# Patient Record
Sex: Male | Born: 1990 | Race: Black or African American | Hispanic: No | Marital: Single | Smoking: Former smoker
Health system: Southern US, Community
[De-identification: ages and names within clinical notes are randomized; demographics above are authoritative.]

## PROBLEM LIST (undated history)

## (undated) DIAGNOSIS — B20 Human immunodeficiency virus [HIV] disease: Secondary | ICD-10-CM

## (undated) DIAGNOSIS — Z21 Asymptomatic human immunodeficiency virus [HIV] infection status: Secondary | ICD-10-CM

---

## 2017-11-21 ENCOUNTER — Emergency Department: Payer: Managed Care, Other (non HMO)

## 2017-11-21 ENCOUNTER — Encounter: Payer: Self-pay | Admitting: Emergency Medicine

## 2017-11-21 ENCOUNTER — Emergency Department
Admission: EM | Admit: 2017-11-21 | Discharge: 2017-11-21 | Disposition: A | Discharge status: Home / Self Care | Payer: Managed Care, Other (non HMO) | Attending: Emergency Medicine | Admitting: Emergency Medicine

## 2017-11-21 DIAGNOSIS — Y999 Unspecified external cause status: Secondary | ICD-10-CM | POA: Diagnosis not present

## 2017-11-21 DIAGNOSIS — W0110XA Fall on same level from slipping, tripping and stumbling with subsequent striking against unspecified object, initial encounter: Secondary | ICD-10-CM | POA: Insufficient documentation

## 2017-11-21 DIAGNOSIS — Y9301 Activity, walking, marching and hiking: Secondary | ICD-10-CM | POA: Insufficient documentation

## 2017-11-21 DIAGNOSIS — S20212A Contusion of left front wall of thorax, initial encounter: Secondary | ICD-10-CM | POA: Diagnosis not present

## 2017-11-21 DIAGNOSIS — F17200 Nicotine dependence, unspecified, uncomplicated: Secondary | ICD-10-CM | POA: Diagnosis not present

## 2017-11-21 DIAGNOSIS — S299XXA Unspecified injury of thorax, initial encounter: Secondary | ICD-10-CM | POA: Diagnosis present

## 2017-11-21 DIAGNOSIS — Y929 Unspecified place or not applicable: Secondary | ICD-10-CM | POA: Diagnosis not present

## 2017-11-21 MED ORDER — MELOXICAM 15 MG PO TABS: 15.0000 mg | ORAL_TABLET | Freq: Every day | ORAL | 0 refills | Status: AC

## 2017-11-21 NOTE — Discharge Instructions (Addendum)
Please take Mobic 15 mg daily with Tylenol 1000 mg every 6 hours as needed.  Apply ice to the left ribs.  If any shortness of breath, fevers, increasing cough, worsening symptoms or to changes in health return to the emergency department.

## 2017-11-21 NOTE — ED Triage Notes (Signed)
Patient presents to the ED with right rib pain post fall yesterday.  Patient states pain is worse with deep breathing, left arm movement or heavy lifting.  Patient states if he is sitting still he is not in pain.  Patient states he fell after having, "a couple of drinks, states he tripped over his shoe laces."  Patient denies hitting his head or passing out.  Patient is in no obvious distress at this time.

## 2017-11-21 NOTE — ED Provider Notes (Signed)
[facility] EMERGENCY DEPARTMENT Provider Note   CSN: [id number] Arrival date & time: 11/21/17  1805     History   Chief Complaint Chief Complaint  Patient presents with  . Fall    HPI Kent Page is a 27 y.o. male.  Presents to the emergency department for evaluation of left rib pain.  Patient states he fell and injured the left ribs yesterday.  He states he tripped over his shoelaces and landed on the ground.  Denies hitting his head, losing consciousness.  Only complains of left rib pain.  No chest pain, abdominal pain, back pain, upper extremity or lower extremity discomfort.  He has not take any medications for pain.  His pain is moderate, sharp and increased with taking a deep breath.  He gets relief with being still.  HPI  History reviewed. No pertinent past medical history.  There are no active problems to display for this patient.   History reviewed. No pertinent surgical history.      Home Medications    Prior to Admission medications   Medication Sig Start Date End Date Taking? Authorizing Provider  meloxicam (MOBIC) 15 MG tablet Take 1 tablet (15 mg total) by mouth daily. 11/21/17 11/21/18  Evon Slack, PA-C    Family History No family history on file.  Social History Social History   Tobacco Use  . Smoking status: Current Some Day Smoker  . Smokeless tobacco: Never Used  Substance Use Topics  . Alcohol use: Yes    Comment: daily  . Drug use: Not on file     Allergies   Patient has no known allergies.   Review of Systems Review of Systems  Constitutional: Negative.  Negative for activity change, appetite change, chills and fever.  Eyes: Negative for photophobia, pain and discharge.  Respiratory: Negative for cough, chest tightness and shortness of breath.   Cardiovascular: Positive for chest pain. Negative for leg swelling.  Gastrointestinal: Negative for abdominal distention, abdominal pain, diarrhea, nausea  and vomiting.  Musculoskeletal: Negative for arthralgias, back pain, gait problem and neck pain.  Skin: Negative for color change and rash.  Neurological: Negative for dizziness and headaches.  Hematological: Negative for adenopathy.  Psychiatric/Behavioral: Negative for agitation and behavioral problems.     Physical Exam Updated Vital Signs BP 132/65 (BP Location: Left Arm)   Pulse 70   Temp 98.1 F (36.7 C) (Oral)   Resp 18   Ht 5\' 6"  (1.676 m)   Wt 64.4 kg   SpO2 100%   BMI 22.92 kg/m   Physical Exam  Constitutional: He is oriented to person, place, and time. He appears well-developed and well-nourished.  HENT:  Head: Normocephalic and atraumatic.  Eyes: Conjunctivae are normal.  Neck: Normal range of motion.  Cardiovascular: Normal rate.  Pulmonary/Chest: Effort normal. No stridor. No respiratory distress. He has no wheezes. He has no rales. He exhibits tenderness (Positive left rib tenderness along the axillary line at the level of the xiphoid.  No bruising, step-off.  Mild tenderness palpation.).  Abdominal: Soft. He exhibits no distension. There is no tenderness. There is no guarding.  Musculoskeletal: Normal range of motion.  Neurological: He is alert and oriented to person, place, and time.  Skin: Skin is warm. No rash noted.  Psychiatric: He has a normal mood and affect. His behavior is normal. Thought content normal.     ED Treatments / Results  Labs (all labs ordered are listed, but only abnormal results are displayed)  Labs Reviewed - No data to display  EKG None  Radiology Dg Ribs Unilateral W/chest Left  Result Date: 11/21/2017 CLINICAL DATA:  Initial evaluation for acute trauma, fall, left rib pain. EXAM: LEFT RIBS AND CHEST - 3+ VIEW COMPARISON:  None. FINDINGS: Cardiac and mediastinal silhouettes are within normal limits. Lungs normally inflated. No focal infiltrates. No pulmonary edema or pleural effusion. No pneumothorax. Dedicated views of the  left ribs were performed. No acute displaced rib fracture identified. No other acute osseous abnormality. IMPRESSION: 1. No acute displaced rib fracture identified. 2. No other active cardiopulmonary disease. Electronically Signed   By: Rise Mu M.D.   On: 11/21/2017 19:30    Procedures Procedures (including critical care time)  Medications Ordered in ED Medications - No data to display   Initial Impression / Assessment and Plan / ED Course  I have reviewed the triage vital signs and the nursing notes.  Pertinent labs & imaging results that were available during my care of the patient were reviewed by me and considered in my medical decision making (see chart for details).     27 year old male with left rib pain after fall 1 day ago.  Pain is been mild to moderate, he has been without any medications for his pain.  He is started on meloxicam.  Vital signs are stable.  X-rays show no evidence of acute cardiopulmonary process nor any displaced left rib fracture.  Patient understands signs symptoms return to the ED for.  Final Clinical Impressions(s) / ED Diagnoses   Final diagnoses:  Rib contusion, left, initial encounter    ED Discharge Orders         Ordered    meloxicam (MOBIC) 15 MG tablet  Daily     11/21/17 2001           Ronnette Juniper 11/21/17 2010    Dionne Bucy, MD 11/21/17 2351

## 2021-06-19 ENCOUNTER — Other Ambulatory Visit: Payer: Self-pay

## 2021-06-19 ENCOUNTER — Emergency Department
Admission: EM | Admit: 2021-06-19 | Discharge: 2021-06-19 | Disposition: A | Discharge status: Home / Self Care | Payer: Self-pay | Attending: Emergency Medicine | Admitting: Emergency Medicine

## 2021-06-19 ENCOUNTER — Emergency Department: Payer: Self-pay

## 2021-06-19 DIAGNOSIS — R21 Rash and other nonspecific skin eruption: Secondary | ICD-10-CM | POA: Insufficient documentation

## 2021-06-19 DIAGNOSIS — Z21 Asymptomatic human immunodeficiency virus [HIV] infection status: Secondary | ICD-10-CM | POA: Insufficient documentation

## 2021-06-19 DIAGNOSIS — R000 Tachycardia, unspecified: Secondary | ICD-10-CM | POA: Insufficient documentation

## 2021-06-19 DIAGNOSIS — J189 Pneumonia, unspecified organism: Secondary | ICD-10-CM | POA: Insufficient documentation

## 2021-06-19 DIAGNOSIS — R630 Anorexia: Secondary | ICD-10-CM | POA: Insufficient documentation

## 2021-06-19 DIAGNOSIS — R824 Acetonuria: Secondary | ICD-10-CM | POA: Insufficient documentation

## 2021-06-19 LAB — TROPONIN I (HIGH SENSITIVITY)
Troponin I (High Sensitivity): 4 ng/L (ref <18)
Troponin I (High Sensitivity): <2 ng/L (ref <18)

## 2021-06-19 LAB — URINALYSIS, ROUTINE W REFLEX MICROSCOPIC
Bacteria, UA: NONE SEEN
Bilirubin Urine: NEGATIVE
Glucose, UA: NEGATIVE mg/dL
Hgb urine dipstick: NEGATIVE
Ketones, ur: 20 mg/dL — AB
Leukocytes,Ua: NEGATIVE
Nitrite: NEGATIVE
Protein, ur: 100 mg/dL — AB
Specific Gravity, Urine: 1.027 (ref 1.005–1.030)
Squamous Epithelial / LPF: NONE SEEN (ref 0–5)
pH: 6 (ref 5.0–8.0)

## 2021-06-19 LAB — CBC WITH DIFFERENTIAL/PLATELET
Abs Immature Granulocytes: 0.03 10*3/uL (ref 0.00–0.07)
Basophils Absolute: 0 10*3/uL (ref 0.0–0.1)
Basophils Relative: 0 %
Eosinophils Absolute: 0.2 10*3/uL (ref 0.0–0.5)
Eosinophils Relative: 3 %
HCT: 38.9 % — ABNORMAL LOW (ref 39.0–52.0)
Hemoglobin: 13 g/dL (ref 13.0–17.0)
Immature Granulocytes: 0 %
Lymphocytes Relative: 17 %
Lymphs Abs: 1.2 10*3/uL (ref 0.7–4.0)
MCH: 29 pg (ref 26.0–34.0)
MCHC: 33.4 g/dL (ref 30.0–36.0)
MCV: 86.6 fL (ref 80.0–100.0)
Monocytes Absolute: 0.7 10*3/uL (ref 0.1–1.0)
Monocytes Relative: 10 %
Neutro Abs: 4.8 10*3/uL (ref 1.7–7.7)
Neutrophils Relative %: 70 %
Platelets: 494 10*3/uL — ABNORMAL HIGH (ref 150–400)
RBC: 4.49 MIL/uL (ref 4.22–5.81)
RDW: 12.1 % (ref 11.5–15.5)
WBC: 7 10*3/uL (ref 4.0–10.5)
nRBC: 0 % (ref 0.0–0.2)

## 2021-06-19 LAB — TSH: TSH: 0.942 u[IU]/mL (ref 0.350–4.500)

## 2021-06-19 LAB — COMPREHENSIVE METABOLIC PANEL
ALT: 23 U/L (ref 0–44)
AST: 29 U/L (ref 15–41)
Albumin: 2.6 g/dL — ABNORMAL LOW (ref 3.5–5.0)
Alkaline Phosphatase: 53 U/L (ref 38–126)
Anion gap: 10 (ref 5–15)
BUN: 15 mg/dL (ref 6–20)
CO2: 25 mmol/L (ref 22–32)
Calcium: 8.5 mg/dL — ABNORMAL LOW (ref 8.9–10.3)
Chloride: 100 mmol/L (ref 98–111)
Creatinine, Ser: 1.21 mg/dL (ref 0.61–1.24)
GFR, Estimated: >60 mL/min (ref >60)
Glucose, Bld: 99 mg/dL (ref 70–99)
Potassium: 4.3 mmol/L (ref 3.5–5.1)
Sodium: 135 mmol/L (ref 135–145)
Total Bilirubin: 0.5 mg/dL (ref 0.3–1.2)
Total Protein: 7.7 g/dL (ref 6.5–8.1)

## 2021-06-19 LAB — RAPID HIV SCREEN (HIV 1/2 AB+AG)
HIV 1/2 Antibodies: REACTIVE — AB
HIV-1 P24 Antigen - HIV24: NONREACTIVE

## 2021-06-19 LAB — CHLAMYDIA/NGC RT PCR (ARMC ONLY)
Chlamydia Tr: NOT DETECTED
N gonorrhoeae: NOT DETECTED

## 2021-06-19 LAB — HIV ANTIBODY (ROUTINE TESTING W REFLEX): HIV Screen 4th Generation wRfx: REACTIVE — AB

## 2021-06-19 MED ORDER — SODIUM CHLORIDE 0.9 % IV SOLN
2.0000 g | Freq: Once | INTRAVENOUS | Status: AC
  Administered 2021-06-19: 2 g via INTRAVENOUS
  Filled 2021-06-19: qty 20

## 2021-06-19 MED ORDER — SODIUM CHLORIDE 0.9 % IV SOLN
500.0000 mg | Freq: Once | INTRAVENOUS | Status: AC
  Administered 2021-06-19: 500 mg via INTRAVENOUS
  Filled 2021-06-19: qty 5

## 2021-06-19 MED ORDER — LEVOFLOXACIN 500 MG PO TABS: 500.0000 mg | ORAL_TABLET | Freq: Every day | ORAL | 0 refills | Status: DC

## 2021-06-19 NOTE — ED Provider Notes (Signed)
? ?[facility] ?Provider Note ? ? ? Event Date/Time  ? First MD Initiated Contact with Patient 06/19/21 1236   ?  (approximate) ? ? ?History  ? ?Rash ? ? ?HPI ? ?Kent Page is a 31 y.o. male with history of male male unprotected sex presents emergency department complaining of a cough, shortness of breath, rash, decreased appetite and 20 pound weight loss in the past few months.  Patient does not have a diagnosis of HIV.  He states he gets fevers on and off.  He is a Agricultural consultant.  Denies vomiting or diarrhea. ? ?  ? ? ?Physical Exam  ? ?Triage Vital Signs: ?ED Triage Vitals [06/19/21 1211]  ?Enc Vitals Group  ?   BP   ?   Pulse   ?   Resp   ?   Temp   ?   Temp src   ?   SpO2   ?   Weight   ?   Height   ?   Head Circumference   ?   Peak Flow   ?   Pain Score 0  ?   Pain Loc   ?   Pain Edu?   ?   Excl. in GC?   ? ? ?Most recent vital signs: ?Vitals:  ? 06/19/21 1518  ?BP: 107/71  ?Pulse: 98  ?Resp: 14  ?Temp: 98.7 ?F (37.1 ?C)  ?SpO2: 98%  ? ? ? ?General: Awake, no distress.   ?CV:  Good peripheral perfusion. regular rate and  rhythm ?Resp:  Normal effort. Lungs CTA ?Abd:  No distention.   ?Other:    ? ? ?ED Results / Procedures / Treatments  ? ?Labs ?(all labs ordered are listed, but only abnormal results are displayed) ?Labs Reviewed  ?COMPREHENSIVE METABOLIC PANEL - Abnormal; Notable for the following components:  ?    Result Value  ? Calcium 8.5 (*)   ? Albumin 2.6 (*)   ? All other components within normal limits  ?CBC WITH DIFFERENTIAL/PLATELET - Abnormal; Notable for the following components:  ? HCT 38.9 (*)   ? Platelets 494 (*)   ? All other components within normal limits  ?URINALYSIS, ROUTINE W REFLEX MICROSCOPIC - Abnormal; Notable for the following components:  ? Color, Urine YELLOW (*)   ? APPearance CLEAR (*)   ? Ketones, ur 20 (*)   ? Protein, ur 100 (*)   ? All other components within normal limits  ?RAPID HIV SCREEN (HIV 1/2 AB+AG) - Abnormal; Notable for  the following components:  ? HIV 1/2 Antibodies Reactive (*)   ? All other components within normal limits  ?CHLAMYDIA/NGC RT PCR ([facility] ONLY)            ?TSH  ?RPR  ?HIV ANTIBODY (ROUTINE TESTING W REFLEX)  ?HELPER T-LYMPH-CD4 ([facility] ONLY)  ?HIV-1 RNA QUANT-NO REFLEX-BLD  ?GENOSURE INTEGRASE HIV EDI  ?TROPONIN I (HIGH SENSITIVITY)  ?TROPONIN I (HIGH SENSITIVITY)  ? ? ? ?EKG ? ?EKG ? ? ?RADIOLOGY ?Chest x-ray ? ? ? ?PROCEDURES: ? ? ?Procedures ? ? ?MEDICATIONS ORDERED IN ED: ?Medications  ?azithromycin (ZITHROMAX) 500 mg in sodium chloride 0.9 % 250 mL IVPB (500 mg Intravenous New Bag/Given 06/19/21 1601)  ?cefTRIAXone (ROCEPHIN) 2 g in sodium chloride 0.9 % 100 mL IVPB (0 g Intravenous Stopped 06/19/21 1601)  ? ? ? ?IMPRESSION / MDM / ASSESSMENT AND PLAN / ED COURSE  ?I reviewed the triage vital signs and the nursing notes. ?             ?               ? ?  Differential diagnosis includes, but is not limited to, HIV, AIDS, CAP, PCP pneumonia, hypothyroidism, syphilis, MI, CHF, PE ? ?The rash on the back of the patient's neck is concerning for may be secondary syphilis.  We will do RPR and HIV to assess HIV status and syphilis status. ? ?GC/chlamydia ordered due to patient's unprotected sex with someone that is a random person. ? ?Blood work for shortness of breath also ordered.  Chest x-ray and EKG. ? ?EKG showed tachycardia at 108.  Patient's heart rate has returned to normal at this time still to 98. ? ?Chest x-ray was independently reviewed by me, does appear to show some pneumonia, read as atypical pneumonia by radiology ? ?Due to the patient's weight loss, rash, fatigue, and male male sex I did do a HIV test.  HIV is reactive.  However patient's other labs are reassuring his CBC, metabolic panel troponin and TSH are all normal.  Urinalysis showed 20 ketones which we will rehydrate him prior to discharge.  GC/chlamydia is negative, RPR still pending ? ?Due to the atypical pneumonia we gave him Rocephin 2 g IV and  Zithromax 500 mg IV.  Patient will be discharged on Levaquin. ? ?Consult infectious disease.  Appointment was made for him to follow-up with Dr. Daleen Bo, he is to see her on May 18 at 945.  Did add lab work for infectious disease including Geno sure integrates HIV, CD4 count, HIV RNA.  These will be followed up by infectious disease. ? ?Patient was given strict instructions to return if worsening.  He is in agreement treatment plan.  Discharged stable condition.  Encouraged to sign up for [facility] health MyChart so he can see all of his lab work.  He was given caring for yourself with HIV instructions along with pneumonia instructions. ? ? ? ? ?  ? ? ?FINAL CLINICAL IMPRESSION(S) / ED DIAGNOSES  ? ?Final diagnoses:  ?Atypical pneumonia  ?HIV test positive (HCC)  ? ? ? ?Rx / DC Orders  ? ?ED Discharge Orders   ? ?      Ordered  ?  levofloxacin (LEVAQUIN) 500 MG tablet  Daily       ? 06/19/21 1742  ? ?  ?  ? ?  ? ? ? ?Note:  This document was prepared using Dragon voice recognition software and may include unintentional dictation errors. ? ?  ?Faythe Ghee, PA-C ?06/19/21 1800 ? ?  ?Jene Every, MD ?06/21/21 1424 ? ?

## 2021-06-19 NOTE — Discharge Instructions (Addendum)
Follow up with infectious disease on May 18 at 945 am.  Return to the ER if worsening ?Take the  medication as prescribed ?

## 2021-06-19 NOTE — ED Triage Notes (Signed)
Pt comes with c/o rash and lost of appetite for few days.  ?

## 2021-06-19 NOTE — ED Notes (Signed)
E signature pad not working. Pt educated on discharge instructions and verbalized understanding.  

## 2021-06-20 LAB — RPR: RPR Ser Ql: NONREACTIVE

## 2021-06-21 LAB — HELPER T-LYMPH-CD4 (ARMC ONLY)
% CD 4 Pos. Lymph.: 2.5 % — ABNORMAL LOW (ref 30.8–58.5)
Absolute CD 4 Helper: 28 /uL — ABNORMAL LOW (ref 359–1519)
Basophils Absolute: 0 10*3/uL (ref 0.0–0.2)
Basos: 0 %
EOS (ABSOLUTE): 0.2 10*3/uL (ref 0.0–0.4)
Eos: 3 %
Hematocrit: 36.3 % — ABNORMAL LOW (ref 37.5–51.0)
Hemoglobin: 12 g/dL — ABNORMAL LOW (ref 13.0–17.7)
Immature Grans (Abs): 0.1 10*3/uL (ref 0.0–0.1)
Immature Granulocytes: 1 %
Lymphocytes Absolute: 1.1 10*3/uL (ref 0.7–3.1)
Lymphs: 16 %
MCH: 29.3 pg (ref 26.6–33.0)
MCHC: 33.1 g/dL (ref 31.5–35.7)
MCV: 89 fL (ref 79–97)
Monocytes Absolute: 0.8 10*3/uL (ref 0.1–0.9)
Monocytes: 12 %
Neutrophils Absolute: 4.6 10*3/uL (ref 1.4–7.0)
Neutrophils: 68 %
Platelets: 496 10*3/uL — ABNORMAL HIGH (ref 150–450)
RBC: 4.09 x10E6/uL — ABNORMAL LOW (ref 4.14–5.80)
RDW: 12.3 % (ref 11.6–15.4)
WBC: 6.8 10*3/uL (ref 3.4–10.8)

## 2021-06-21 LAB — HIV-1 RNA QUANT-NO REFLEX-BLD
HIV 1 RNA Quant: 421000 copies/mL
LOG10 HIV-1 RNA: 5.624 log10copy/mL

## 2021-06-22 LAB — HIV-1/2 AB - DIFFERENTIATION
HIV 1 Ab: REACTIVE
HIV 2 Ab: NONREACTIVE

## 2021-06-26 ENCOUNTER — Other Ambulatory Visit: Payer: Self-pay

## 2021-06-26 ENCOUNTER — Encounter: Payer: Self-pay | Admitting: Internal Medicine

## 2021-06-26 ENCOUNTER — Inpatient Hospital Stay
Admission: EM | Admit: 2021-06-26 | Discharge: 2021-07-04 | DRG: 974 | Disposition: A | Discharge status: Home / Self Care | Payer: No Typology Code available for payment source | Attending: Obstetrics and Gynecology | Admitting: Obstetrics and Gynecology

## 2021-06-26 ENCOUNTER — Encounter: Payer: Self-pay | Admitting: Infectious Diseases

## 2021-06-26 ENCOUNTER — Ambulatory Visit: Payer: No Typology Code available for payment source | Attending: Infectious Diseases | Admitting: Infectious Diseases

## 2021-06-26 ENCOUNTER — Emergency Department: Payer: No Typology Code available for payment source

## 2021-06-26 VITALS — BP 103/71 | HR 113 | Temp 97.5°F | Ht 66.0 in | Wt 117.0 lb

## 2021-06-26 DIAGNOSIS — R0600 Dyspnea, unspecified: Secondary | ICD-10-CM | POA: Diagnosis not present

## 2021-06-26 DIAGNOSIS — R7989 Other specified abnormal findings of blood chemistry: Secondary | ICD-10-CM

## 2021-06-26 DIAGNOSIS — B20 Human immunodeficiency virus [HIV] disease: Secondary | ICD-10-CM | POA: Insufficient documentation

## 2021-06-26 DIAGNOSIS — E875 Hyperkalemia: Secondary | ICD-10-CM | POA: Diagnosis not present

## 2021-06-26 DIAGNOSIS — E43 Unspecified severe protein-calorie malnutrition: Secondary | ICD-10-CM | POA: Diagnosis present

## 2021-06-26 DIAGNOSIS — R0902 Hypoxemia: Secondary | ICD-10-CM | POA: Insufficient documentation

## 2021-06-26 DIAGNOSIS — E871 Hypo-osmolality and hyponatremia: Secondary | ICD-10-CM | POA: Diagnosis not present

## 2021-06-26 DIAGNOSIS — J432 Centrilobular emphysema: Secondary | ICD-10-CM | POA: Diagnosis present

## 2021-06-26 DIAGNOSIS — N179 Acute kidney failure, unspecified: Secondary | ICD-10-CM | POA: Diagnosis present

## 2021-06-26 DIAGNOSIS — Z20822 Contact with and (suspected) exposure to covid-19: Secondary | ICD-10-CM | POA: Diagnosis present

## 2021-06-26 DIAGNOSIS — B59 Pneumocystosis: Secondary | ICD-10-CM

## 2021-06-26 DIAGNOSIS — R0602 Shortness of breath: Secondary | ICD-10-CM | POA: Diagnosis present

## 2021-06-26 DIAGNOSIS — F1210 Cannabis abuse, uncomplicated: Secondary | ICD-10-CM | POA: Diagnosis present

## 2021-06-26 DIAGNOSIS — I959 Hypotension, unspecified: Secondary | ICD-10-CM | POA: Diagnosis present

## 2021-06-26 DIAGNOSIS — E8809 Other disorders of plasma-protein metabolism, not elsewhere classified: Secondary | ICD-10-CM | POA: Diagnosis present

## 2021-06-26 DIAGNOSIS — J189 Pneumonia, unspecified organism: Secondary | ICD-10-CM | POA: Diagnosis not present

## 2021-06-26 DIAGNOSIS — D75839 Thrombocytosis, unspecified: Secondary | ICD-10-CM | POA: Diagnosis not present

## 2021-06-26 DIAGNOSIS — D649 Anemia, unspecified: Secondary | ICD-10-CM | POA: Diagnosis present

## 2021-06-26 DIAGNOSIS — Z716 Tobacco abuse counseling: Secondary | ICD-10-CM

## 2021-06-26 DIAGNOSIS — B259 Cytomegaloviral disease, unspecified: Secondary | ICD-10-CM | POA: Diagnosis not present

## 2021-06-26 DIAGNOSIS — Z681 Body mass index (BMI) 19 or less, adult: Secondary | ICD-10-CM | POA: Diagnosis not present

## 2021-06-26 HISTORY — DX: Asymptomatic human immunodeficiency virus (hiv) infection status: Z21

## 2021-06-26 HISTORY — DX: Human immunodeficiency virus (HIV) disease: B20

## 2021-06-26 LAB — COMPREHENSIVE METABOLIC PANEL
ALT: 29 U/L (ref 0–44)
AST: 35 U/L (ref 15–41)
Albumin: 2.4 g/dL — ABNORMAL LOW (ref 3.5–5.0)
Alkaline Phosphatase: 50 U/L (ref 38–126)
Anion gap: 12 (ref 5–15)
BUN: 13 mg/dL (ref 6–20)
CO2: 24 mmol/L (ref 22–32)
Calcium: 8.5 mg/dL — ABNORMAL LOW (ref 8.9–10.3)
Chloride: 99 mmol/L (ref 98–111)
Creatinine, Ser: 1.11 mg/dL (ref 0.61–1.24)
GFR, Estimated: >60 mL/min (ref >60)
Glucose, Bld: 146 mg/dL — ABNORMAL HIGH (ref 70–99)
Potassium: 3.9 mmol/L (ref 3.5–5.1)
Sodium: 135 mmol/L (ref 135–145)
Total Bilirubin: 0.5 mg/dL (ref 0.3–1.2)
Total Protein: 7.4 g/dL (ref 6.5–8.1)

## 2021-06-26 LAB — BLOOD GAS, ARTERIAL
Acid-Base Excess: 1.8 mmol/L (ref 0.0–2.0)
Bicarbonate: 24.4 mmol/L (ref 20.0–28.0)
O2 Saturation: 96.6 %
Patient temperature: 37
pCO2 arterial: 32 mmHg (ref 32–48)
pH, Arterial: 7.49 — ABNORMAL HIGH (ref 7.35–7.45)
pO2, Arterial: 79 mmHg — ABNORMAL LOW (ref 83–108)

## 2021-06-26 LAB — CBC WITH DIFFERENTIAL/PLATELET
Abs Immature Granulocytes: 0.05 10*3/uL (ref 0.00–0.07)
Basophils Absolute: 0 10*3/uL (ref 0.0–0.1)
Basophils Relative: 0 %
Eosinophils Absolute: 0.2 10*3/uL (ref 0.0–0.5)
Eosinophils Relative: 2 %
HCT: 36.8 % — ABNORMAL LOW (ref 39.0–52.0)
Hemoglobin: 12.2 g/dL — ABNORMAL LOW (ref 13.0–17.0)
Immature Granulocytes: 1 %
Lymphocytes Relative: 8 %
Lymphs Abs: 0.8 10*3/uL (ref 0.7–4.0)
MCH: 28.8 pg (ref 26.0–34.0)
MCHC: 33.2 g/dL (ref 30.0–36.0)
MCV: 87 fL (ref 80.0–100.0)
Monocytes Absolute: 0.7 10*3/uL (ref 0.1–1.0)
Monocytes Relative: 7 %
Neutro Abs: 7.8 10*3/uL — ABNORMAL HIGH (ref 1.7–7.7)
Neutrophils Relative %: 82 %
Platelets: 606 10*3/uL — ABNORMAL HIGH (ref 150–400)
RBC: 4.23 MIL/uL (ref 4.22–5.81)
RDW: 12.1 % (ref 11.5–15.5)
WBC: 9.4 10*3/uL (ref 4.0–10.5)
nRBC: 0 % (ref 0.0–0.2)

## 2021-06-26 LAB — LACTATE DEHYDROGENASE: LDH: 190 U/L (ref 98–192)

## 2021-06-26 LAB — RESP PANEL BY RT-PCR (FLU A&B, COVID) ARPGX2
Influenza A by PCR: NEGATIVE
Influenza B by PCR: NEGATIVE
SARS Coronavirus 2 by RT PCR: NEGATIVE

## 2021-06-26 LAB — TROPONIN I (HIGH SENSITIVITY): Troponin I (High Sensitivity): 4 ng/L (ref <18)

## 2021-06-26 LAB — LACTIC ACID, PLASMA
Lactic Acid, Venous: 2.4 mmol/L (ref 0.5–1.9)
Lactic Acid, Venous: 0.8 mmol/L (ref 0.5–1.9)

## 2021-06-26 MED ORDER — SODIUM CHLORIDE 0.9 % IV BOLUS
1000.0000 mL | Freq: Once | INTRAVENOUS | Status: AC
  Administered 2021-06-26: 1000 mL via INTRAVENOUS

## 2021-06-26 MED ORDER — SODIUM CHLORIDE 0.9% FLUSH
3.0000 mL | Freq: Two times a day (BID) | INTRAVENOUS | Status: DC
  Administered 2021-06-26 – 2021-07-04 (×16): 3 mL via INTRAVENOUS

## 2021-06-26 MED ORDER — SODIUM CHLORIDE 0.9 % IV SOLN: 250.0000 mL | INTRAVENOUS | Status: DC | PRN

## 2021-06-26 MED ORDER — SODIUM CHLORIDE 0.9% FLUSH: 3.0000 mL | INTRAVENOUS | Status: DC | PRN

## 2021-06-26 MED ORDER — ALBUTEROL SULFATE (2.5 MG/3ML) 0.083% IN NEBU: 2.5000 mg | INHALATION_SOLUTION | RESPIRATORY_TRACT | Status: DC | PRN

## 2021-06-26 MED ORDER — ACETAMINOPHEN 650 MG RE SUPP: 650.0000 mg | Freq: Four times a day (QID) | RECTAL | Status: DC | PRN

## 2021-06-26 MED ORDER — ONDANSETRON HCL 4 MG PO TABS: 4.0000 mg | ORAL_TABLET | Freq: Four times a day (QID) | ORAL | Status: DC | PRN

## 2021-06-26 MED ORDER — ENOXAPARIN SODIUM 40 MG/0.4ML IJ SOSY
40.0000 mg | PREFILLED_SYRINGE | INTRAMUSCULAR | Status: DC
  Administered 2021-06-26 – 2021-07-03 (×8): 40 mg via SUBCUTANEOUS
  Filled 2021-06-26 (×8): qty 0.4

## 2021-06-26 MED ORDER — ACETAMINOPHEN 325 MG PO TABS
650.0000 mg | ORAL_TABLET | Freq: Four times a day (QID) | ORAL | Status: DC | PRN
  Administered 2021-06-28 – 2021-06-29 (×3): 650 mg via ORAL
  Filled 2021-06-26 (×3): qty 2

## 2021-06-26 MED ORDER — ONDANSETRON HCL 4 MG/2ML IJ SOLN
4.0000 mg | Freq: Four times a day (QID) | INTRAMUSCULAR | Status: DC | PRN
  Administered 2021-06-30: 4 mg via INTRAVENOUS
  Filled 2021-06-26: qty 2

## 2021-06-26 MED ORDER — SULFAMETHOXAZOLE-TRIMETHOPRIM 400-80 MG/5ML IV SOLN
320.0000 mg | Freq: Three times a day (TID) | INTRAVENOUS | Status: AC
  Administered 2021-06-26 – 2021-07-03 (×20): 320 mg via INTRAVENOUS
  Filled 2021-06-26 (×16): qty 20
  Filled 2021-06-26: qty 10
  Filled 2021-06-26 (×4): qty 20

## 2021-06-26 MED ORDER — SENNOSIDES-DOCUSATE SODIUM 8.6-50 MG PO TABS: 1.0000 | ORAL_TABLET | Freq: Every evening | ORAL | Status: DC | PRN

## 2021-06-26 MED ORDER — PANTOPRAZOLE SODIUM 40 MG PO TBEC
40.0000 mg | DELAYED_RELEASE_TABLET | Freq: Every day | ORAL | Status: DC
  Administered 2021-06-26 – 2021-07-04 (×8): 40 mg via ORAL
  Filled 2021-06-26 (×8): qty 1

## 2021-06-26 MED ORDER — PREDNISONE 20 MG PO TABS
40.0000 mg | ORAL_TABLET | Freq: Two times a day (BID) | ORAL | Status: DC
  Administered 2021-06-26: 40 mg via ORAL
  Filled 2021-06-26: qty 2

## 2021-06-26 NOTE — Progress Notes (Signed)
Pharmacy Antibiotic Note ? ?Kent Page is a 31 y.o. male admitted on 06/26/2021 with possible pneumocystis pneumonia.  Pharmacy has been consulted for trimethoprim/sulfamethoxazole dosing. Presenting from ID clinic with shortness of breath.  He is newly diagnosed with HIV with a CD4 = 29.   ? ?Today, 06/26/2021 ?CXR with Bilateral ground glass opacities ?WBC WNL ?No fevers ?SCr WNL ?K+ WNL ?LDH = 190 ? ?Plan: ?TMP/SMZ 320mg  IV q8h (=17.6 mg/kg/day of TMP) ?Monitor renal function and K+ - CMP ordered for tomorrow ?Await labs: Beta-D glucan ordered ?Await possible bronchoscopy ? ?Height: 5\' 6"  (167.6 cm) ?Weight: 54.4 kg (120 lb) ?IBW/kg (Calculated) : 63.8 ? ?Temp (24hrs), Avg:98 ?F (36.7 ?C), Min:97.5 ?F (36.4 ?C), Max:98.9 ?F (37.2 ?C) ? ?Recent Labs  ?Lab 06/19/21 ?1827 06/26/21 ?1150 06/26/21 ?1151  ?WBC 6.8  --  9.4  ?CREATININE  --   --  1.11  ?LATICACIDVEN  --  2.4*  --   ?  ?Estimated Creatinine Clearance: 72.8 mL/min (by C-G formula based on SCr of 1.11 mg/dL).   ? ?No Known Allergies ? ?Antimicrobials this admission: ?TMP/SMZ 5/16 >> ? ? ?Microbiology results: ?5/16 Bcx:  ? ?Thank you for allowing pharmacy to be a part of this patient?s care. ? ?6/16, PharmD, BCPS, BCIDP ?Work Cell: [phone] ?06/26/2021 4:43 PM ? ? ? ?

## 2021-06-26 NOTE — Hospital Course (Addendum)
Patient is a 31 years old male with past medical history of recently diagnosed HIV was sent from ID clinic with concerns for increasing shortness of breath with hypoxia noted in the clinic with pulse ox in the 80s is in room air.  Patient was newly diagnosed of HIV/AIDs recently with a CD4 count of 29.  He was in the process of setting up treatment plan with ID   There was some concern for PJP pneumonia and the patient was sent to the hospital.  Patient states that he has been having dyspnea and shortness of breath since April which has been gradually worsening at this time.  He does have dyspnea on exertion and has mostly cough which is dry in nature with some productive cough in the morning.  Denies any fever,chills or rigor.  Denies any chest pain.  No hemoptysis.  Denies any sick contacts.  Does have a diminished appetite and has lost around 20 pounds of weight loss since April.  Denies any nausea vomiting or diarrhea.  Denies any urinary urgency frequency or dysuria.  He states that he has been smoking marijuana but denies smoking cigarettes recently.  Used to drink alcohol but has quit since March 2023.  In the ED, patient was mildly hypotensive with blood pressure of 96/77.  Pulse oximetry was however 95% on room air .  Repeated test showed WBC at 9.4 with hemoglobin of 12.2.  COVID and influenza was negative.  Lactic acid was elevated at 2.4 sodium of 135 with potassium of 3.9.  Creatinine was 1.1.  LFTs were within normal limits.  Patient received 1 L of IV fluid bolus in the ED.  Pulmonary was consulted from the ED for bronchoscopy.  Patient was then considered for admission to hospital for further evaluation and treatment.  5/17: Patient was waiting for bronchoscopy when seen today.  He was very concerned about not starting HIV treatment early.  5/18: Patient had successful bronchoscopy and BAL was done, preliminary cultures are negative so far.  Patient developed fever at 101.3 this morning  requiring Tylenol. Repeat blood cultures were done. Repeat chest x-ray with little worsening of right-sided infiltrates Per ID we will continue with Bactrim at this time and follow the cultures.  5/19: Patient remained afebrile after having fever yesterday morning.  Repeat blood cultures are negative so far.  BAL cultures are negative so far.  Fungal cultures are pending. Giving another bolus for softer blood pressure.  5/20: Patient developed fever up to 101.3 last night around 8 PM.  Remained febrile this morning at 100.2.  HAART got delayed till Sunday as he needs to be on prednisone for at least 48 hours before starting antiviral medications for HIV to prevent immune reconstitution syndrome.  He was started on prednisone yesterday.  CBC with mild leukocytosis now, hemoglobin down at 10.7 with stable thrombocytosis at 572.  Blood cultures remain negative.  BAL culture remain negative.  Fungal cultures negative.  pneumocystic, toxo, RPR, hepatitis panel, cryptococcal labs are negative.  5/21: Afebrile this morning, last recorded fever of 100.2 yesterday around 8 PM.  Pulmonary did CTA to rule out PE due to tachycardia and tachypnea with minor exertion.  It was negative for PE and did show diffuse bilateral groundglass opacities which may represent edema, pneumonia or ARDS.  Patient is being started on HAART with Biktarvy today.  5/22: Patient was started on [place] yesterday.  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

## 2021-06-26 NOTE — ED Triage Notes (Signed)
Pt here with SOB and was told to come to the ED for a positive test result. Pt was hypoxia at the clinic but denies pain. ?

## 2021-06-26 NOTE — Consult Note (Signed)
? ? ? ?PULMONOLOGY ? ? ? ? ? ? ? ? ?Date: 06/26/2021,   ?MRN# [id number] Kent Page 19-Sep-1990 ? ? ?  ?AdmissionWeight: 54.4 kg                 ?CurrentWeight: 54.4 kg ? ?Referring provider: Dr. Rip Harbour ? ? ?CHIEF COMPLAINT:  ? 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 ? ?HISTORY OF PRESENT ILLNESS  ? ?31 year old patient with newly diagnosed HIV/AIDS coming in from infectious disease due to worsening tachycardia and hypoxemia.  Recent CD4 count of 29.  Was setting up plan with infectious disease for AIDS therapy.  Had concerns for PJP pneumonia and was sent to the hospital to evaluate for this.  Reports ongoing progressive shortness of breath from April denies sick contacts does admit to productive cough denies fevers chills hemoptysis has been anorexic with over 20 pound weight loss in the last few months.  Generally smokes marijuana and cigarettes daily does use alcohol but declines if he is with alcoholism has not had seizures in the past.  In the ED was noted to be hypotensive tachycardic with hypoxemia on mild exertion mild AKI with creatinine 1.1 otherwise electrolytes are fairly preserved received resuscitative fluids pulmonary consultation for bronchoscopic airway inspection and microbiology with bronchoalveolar lavage. ? ? ?PAST MEDICAL HISTORY  ? ?Past Medical History:  ?Diagnosis Date  ? HIV (human immunodeficiency virus infection) ([place])   ? ? ? ?SURGICAL HISTORY  ? ?History reviewed. No pertinent surgical history. ? ? ?FAMILY HISTORY  ? ?History reviewed. No pertinent family history. ? ? ?SOCIAL HISTORY  ? ?Social History  ? ?Tobacco Use  ? Smoking status: Former  ?  Types: Cigarettes  ?  Passive exposure: Past  ? Smokeless tobacco: Never  ?Vaping Use  ? Vaping Use: Never used  ?Substance Use Topics  ? Alcohol use: Yes  ?  Comment: daily  ? Drug use: Not Currently  ? ? ? ?MEDICATIONS  ? ? ?Home Medication:  ?Current Outpatient Rx  ? Order #: [id number] Class: Print  ?  ?Current  Medication: ? ?Current Facility-Administered Medications:  ?  0.9 %  sodium chloride infusion, 250 mL, Intravenous, PRN, Pokhrel, Laxman, MD ?  acetaminophen (TYLENOL) tablet 650 mg, 650 mg, Oral, Q6H PRN **OR** acetaminophen (TYLENOL) suppository 650 mg, 650 mg, Rectal, Q6H PRN, Pokhrel, Laxman, MD ?  albuterol (PROVENTIL) (2.5 MG/3ML) 0.083% nebulizer solution 2.5 mg, 2.5 mg, Nebulization, Q4H PRN, Pokhrel, Laxman, MD ?  enoxaparin (LOVENOX) injection 40 mg, 40 mg, Subcutaneous, Q24H, Pokhrel, Laxman, MD ?  ondansetron (ZOFRAN) tablet 4 mg, 4 mg, Oral, Q6H PRN **OR** ondansetron (ZOFRAN) injection 4 mg, 4 mg, Intravenous, Q6H PRN, Pokhrel, Laxman, MD ?  senna-docusate (Senokot-S) tablet 1 tablet, 1 tablet, Oral, QHS PRN, Pokhrel, Laxman, MD ?  sodium chloride 0.9 % bolus 1,000 mL, 1,000 mL, Intravenous, Once, Pokhrel, Laxman, MD ?  sodium chloride flush (NS) 0.9 % injection 3 mL, 3 mL, Intravenous, Q12H, Pokhrel, Laxman, MD ?  sodium chloride flush (NS) 0.9 % injection 3 mL, 3 mL, Intravenous, PRN, Pokhrel, Laxman, MD ? ?Current Outpatient Medications:  ?  levofloxacin (LEVAQUIN) 500 MG tablet, Take 1 tablet (500 mg total) by mouth daily for 10 days., Disp: 10 tablet, Rfl: 0 ? ? ? ?ALLERGIES  ? ?Patient has no known allergies. ? ? ? ? ?REVIEW OF SYSTEMS  ? ? ?Review of Systems: ? ?Gen:  Denies  fever, sweats, chills weigh loss  ?HEENT: Denies blurred vision, double vision, ear  pain, eye pain, hearing loss, nose bleeds, sore throat ?Cardiac:  No dizziness, chest pain or heaviness, chest tightness,edema ?Resp:   reports dyspnea chronically  ?Gi: Denies swallowing difficulty, stomach pain, nausea or vomiting, diarrhea, constipation, bowel incontinence ?Gu:  Denies bladder incontinence, burning urine ?Ext:   Denies Joint pain, stiffness or swelling ?Skin: Denies  skin rash, easy bruising or bleeding or hives ?Endoc:  Denies polyuria, polydipsia , polyphagia or weight change ?Psych:   Denies depression, insomnia or  hallucinations  ? ?Other:  All other systems negative ? ? ?VS: BP 96/77 (BP Location: Left Arm)   Pulse 99   Temp 97.6 ?F (36.4 ?C) (Oral)   Resp (!) 26   Ht _0  (1.676 m)   Wt 54.4 kg   SpO2 97%   BMI 19.37 kg/m?   ? ? ? ?PHYSICAL EXAM  ? ? ?GENERAL:NAD, no fevers, chills, no weakness no fatigue ?HEAD: Normocephalic, atraumatic.  ?EYES: Pupils equal, round, reactive to light. Extraocular muscles intact. No scleral icterus.  ?MOUTH: Moist mucosal membrane. Dentition intact. No abscess noted.  ?EAR, NOSE, THROAT: Clear without exudates. No external lesions.  ?NECK: Supple. No thyromegaly. No nodules. No JVD.  ?PULMONARY: decreased breath sounds with mild rhonchi worse at bases bilaterally.  ?CARDIOVASCULAR: S1 and S2. Regular rate and rhythm. No murmurs, rubs, or gallops. No edema. Pedal pulses 2+ bilaterally.  ?GASTROINTESTINAL: Soft, nontender, nondistended. No masses. Positive bowel sounds. No hepatosplenomegaly.  ?MUSCULOSKELETAL: No swelling, clubbing, or edema. Range of motion full in all extremities.  ?NEUROLOGIC: Cranial nerves II through XII are intact. No gross focal neurological deficits. Sensation intact. Reflexes intact.  ?SKIN: No ulceration, lesions, rashes, or cyanosis. Skin warm and dry. Turgor intact.  ?PSYCHIATRIC: Mood, affect within normal limits. The patient is awake, alert and oriented x 3. Insight, judgment intact.  ? ? ?  ? ?IMAGING  ? ?_1 @  ? ?ASSESSMENT/PLAN  ? ?Acute hypoxemic respiratory failure ?Patient with active AIDS and is under the care of infectious disease team in process of receiving therapy ?-Status post ID evaluation-appreciate input ?-There is recommendation for bronchoscopic evaluation with microbiology to be sent for KS/PJP/CMV ?-Patient reports some improvement since hospitalization and having resuscitative fluids ? ? ?Centrilobular emphysema ?-Active tobacco and THC abuse.  Smoking ?  ? ? ?AIDS ?CD4 less than 50 ?-Infectious disease on case-appreciate  input ? ? ? ?Thank you for allowing me to participate in the care of this patient.  ? ?Patient/Family are satisfied with care plan and all questions have been answered.  ? ? ?Provider disclosure: ?Patient with at least one acute or chronic illness or injury that poses a threat to life or bodily function and is being managed actively during this encounter.  All of the below services have been performed independently by signing provider:  review of prior documentation from internal and or external health records.  Review of previous and current lab results.  Interview and comprehensive assessment during patient visit today. Review of current and previous chest radiographs/CT scans. Discussion of management and test interpretation with health care team and patient/family.  ? ?This document was prepared using Dragon voice recognition software and may include unintentional dictation errors. ? ? ?  ?Ottie Glazier, M.D.  ?Division of Pulmonary & Critical Care Medicine  ? ? ? ? ? ? ? ? ?

## 2021-06-26 NOTE — Consult Note (Signed)
NAME: Kent Page  ?DOB: 10-21-1990  ?MRN: [id number]  ?Date/Time: 06/26/2021 6:12 PM ? ?REQUESTING PROVIDER: Dr.Pokhrel ?Subjective:  ?REASON FOR CONSULT: AIDS/hypoxia ??I had seen patient in my clinic and sent him to the ED for admission ?Kent Page is a 31 y.o. male with a history of newly diagnosed AIDS presents with shortness of breath. ?Patient was diagnosed with AIDS HIV last week when he came to the ED with shortness of breath, fatigue, weight loss over the past 3 to 4 weeks.  Patient states his last test was in 2019 and was negative. ?His dad passed away in 2020 because of liver cirrhosis and patient was moving a lot after that.  Initially attributed the weakness to moving but later as he was getting short of breath he came to the ED.  He does not have a significant cough.  He does not have any fever.  He has some night sweats.  He has significant weight loss.  He has no diarrhea.  He states that shortness of breath is worse with any exertion ?In my clinic pulse ox on room air was initially 88% and then went up to 92% but on walking dropped to 84%. ?He was sent to the ED. ?Vitals in the ED was temperature 98.9, BP 123/75, pulse 109, respiratory 16 and sats of 96% at rest ?WBC 9.4, Hb 12.2, platelets 606. ?Chest x-ray revealed increased interstitial markings. ? ?Past Medical History:  ?Diagnosis Date  ? HIV (human immunodeficiency virus infection) (HCC)   ?  ?History reviewed. No pertinent surgical history.  ?Social History  ? ?Socioeconomic History  ? Marital status: Single  ?  Spouse name: Not on file  ? Number of children: Not on file  ? Years of education: Not on file  ? Highest education level: Not on file  ?Occupational History  ? Not on file  ?Tobacco Use  ? Smoking status: Former  ?  Types: Cigarettes  ?  Passive exposure: Past  ? Smokeless tobacco: Never  ?Vaping Use  ? Vaping Use: Never used  ?Substance and Sexual Activity  ? Alcohol use: Yes  ?  Comment: daily  ? Drug use: Not Currently  ?  Sexual activity: Not on file  ?Other Topics Concern  ? Not on file  ?Social History Narrative  ? Not on file  ? ?Social Determinants of Health  ? ?Financial Resource Strain: Not on file  ?Food Insecurity: Not on file  ?Transportation Needs: Not on file  ?Physical Activity: Not on file  ?Stress: Not on file  ?Social Connections: Not on file  ?Intimate Partner Violence: Not on file  ?  ?FH- father died of liver cirrhosis ? ?No Known Allergies ? ?Current Facility-Administered Medications  ?Medication Dose Route Frequency Provider Last Rate Last Admin  ? 0.9 %  sodium chloride infusion  250 mL Intravenous PRN Pokhrel, Laxman, MD      ? acetaminophen (TYLENOL) tablet 650 mg  650 mg Oral Q6H PRN Pokhrel, Laxman, MD      ? Or  ? acetaminophen (TYLENOL) suppository 650 mg  650 mg Rectal Q6H PRN Pokhrel, Laxman, MD      ? albuterol (PROVENTIL) (2.5 MG/3ML) 0.083% nebulizer solution 2.5 mg  2.5 mg Nebulization Q4H PRN Pokhrel, Laxman, MD      ? enoxaparin (LOVENOX) injection 40 mg  40 mg Subcutaneous Q24H Pokhrel, Laxman, MD      ? ondansetron (ZOFRAN) tablet 4 mg  4 mg Oral Q6H PRN Pokhrel, Laxman, MD      ?  Or  ? ondansetron (ZOFRAN) injection 4 mg  4 mg Intravenous Q6H PRN Pokhrel, Laxman, MD      ? pantoprazole (PROTONIX) EC tablet 40 mg  40 mg Oral Daily Pokhrel, Laxman, MD   40 mg at 06/26/21 1803  ? predniSONE (DELTASONE) tablet 40 mg  40 mg Oral BID WC Pokhrel, Laxman, MD   40 mg at 06/26/21 1803  ? senna-docusate (Senokot-S) tablet 1 tablet  1 tablet Oral QHS PRN Pokhrel, Laxman, MD      ? sodium chloride flush (NS) 0.9 % injection 3 mL  3 mL Intravenous Q12H Pokhrel, Laxman, MD      ? sodium chloride flush (NS) 0.9 % injection 3 mL  3 mL Intravenous PRN Pokhrel, Laxman, MD      ? sulfamethoxazole-trimethoprim (BACTRIM) 320 mg in dextrose 5 % 500 mL IVPB  320 mg Intravenous Q8H Zeigler, Dustin G, RPH      ?  ? ?Abtx:  ?Anti-infectives (From admission, onward)  ? ? Start     Dose/Rate Route Frequency Ordered Stop  ?  06/26/21 1800  sulfamethoxazole-trimethoprim (BACTRIM) 320 mg in dextrose 5 % 500 mL IVPB       ? 320 mg ?346.7 mL/hr over 90 Minutes Intravenous Every 8 hours 06/26/21 1616    ? ?  ? ? ?REVIEW OF SYSTEMS:  ?Const: negative fever, negative chills, ++ weight loss, ++ night sweats ?Eyes: negative diplopia or visual changes, negative eye pain ?ENT: negative coryza, negative sore throat ?Resp: negative cough, hemoptysis, ++dyspnea ?Cards: negative for chest pain, palpitations, lower extremity edema ?GU: negative for frequency, dysuria and hematuria ?GI: Negative for abdominal pain, diarrhea, bleeding, constipation-has poor appetitie ?Skin: negative for rash and pruritus ?Heme: negative for easy bruising and gum/nose bleeding ?MS: negative for myalgias, arthralgias, back pain and muscle weakness ?Neurolo:negative for headaches, dizziness, vertigo, memory problems  ?Psych: negative for feelings of anxiety, depression  ?Endocrine: negative for thyroid, diabetes ?Allergy/Immunology- negative for any medication or food allergies ?? ? ?Objective:  ?VITALS:  ?BP 123/75 (BP Location: Right Arm)   Pulse (!) 109   Temp 98.9 ?F (37.2 ?C)   Resp 16   Ht 5\' 6"  (1.676 m)   Wt 54.4 kg   SpO2 96%   BMI 19.37 kg/m?  ?LDA ?none ?PHYSICAL EXAM:  ?General: Alert, cooperative, no distress, appears stated age. Comfortable at rest ?Head: Normocephalic, without obvious abnormality, atraumatic. ?Eyes: Conjunctivae clear, anicteric sclerae. Pupils are equal ?ENT Nares normal. No drainage or sinus tenderness. ?Tongue coated ?Neck: Supple, symmetrical, no adenopathy, thyroid: non tender ?no carotid bruit and no JVD. ?Back: No CVA tenderness. ?Lungs: few basal crepitations ?Heart:Tachycardia. ?Abdomen: Soft, non-tender,not distended. Bowel sounds normal. No masses ?Extremities: atraumatic, no cyanosis. No edema. No clubbing ?Skin: No rashes or lesions. Or bruising ?Lymph: Cervical, supraclavicular normal. ?Neurologic: Grossly  non-focal ?Pertinent Labs ?Lab Results ?CBC ?   ?Component Value Date/Time  ? WBC 9.4 06/26/2021 1151  ? RBC 4.23 06/26/2021 1151  ? HGB 12.2 (L) 06/26/2021 1151  ? HGB 12.0 (L) 06/19/2021 1827  ? HCT 36.8 (L) 06/26/2021 1151  ? HCT 36.3 (L) 06/19/2021 1827  ? PLT 606 (H) 06/26/2021 1151  ? PLT 496 (H) 06/19/2021 1827  ? MCV 87.0 06/26/2021 1151  ? MCV 89 06/19/2021 1827  ? MCH 28.8 06/26/2021 1151  ? MCHC 33.2 06/26/2021 1151  ? RDW 12.1 06/26/2021 1151  ? RDW 12.3 06/19/2021 1827  ? LYMPHSABS 0.8 06/26/2021 1151  ? LYMPHSABS 1.1 06/19/2021 1827  ?  MONOABS 0.7 06/26/2021 1151  ? EOSABS 0.2 06/26/2021 1151  ? EOSABS 0.2 06/19/2021 1827  ? BASOSABS 0.0 06/26/2021 1151  ? BASOSABS 0.0 06/19/2021 1827  ? ? ? ?  Latest Ref Rng & Units 06/26/2021  ? 11:51 AM 06/19/2021  ?  1:18 PM  ?CMP  ?Glucose 70 - 99 mg/dL 734   99    ?BUN 6 - 20 mg/dL 13   15    ?Creatinine 0.61 - 1.24 mg/dL 1.93   7.90    ?Sodium 135 - 145 mmol/L 135   135    ?Potassium 3.5 - 5.1 mmol/L 3.9   4.3    ?Chloride 98 - 111 mmol/L 99   100    ?CO2 22 - 32 mmol/L 24   25    ?Calcium 8.9 - 10.3 mg/dL 8.5   8.5    ?Total Protein 6.5 - 8.1 g/dL 7.4   7.7    ?Total Bilirubin 0.3 - 1.2 mg/dL 0.5   0.5    ?Alkaline Phos 38 - 126 U/L 50   53    ?AST 15 - 41 U/L 35   29    ?ALT 0 - 44 U/L 29   23    ? ? ? ? ?Microbiology: ?Recent Results (from the past 240 hour(s))  ?Chlamydia/NGC rt PCR ([facility] only)     Status: None  ? Collection Time: 06/19/21  1:18 PM  ? Specimen: Urine, Clean Catch; GU  ?Result Value Ref Range Status  ? Specimen source GC/Chlam URINE, RANDOM  Final  ? Chlamydia Tr NOT DETECTED NOT DETECTED Final  ? N gonorrhoeae NOT DETECTED NOT DETECTED Final  ?  Comment: (NOTE) ?This CT/NG assay has not been evaluated in patients with a history of  ?hysterectomy. ?Performed at [facility], [address] [place], ?[place] [address] ?  ?Resp Panel by RT-PCR (Flu A&B, Covid) Nasopharyngeal Swab     Status: None  ? Collection Time: 06/26/21 11:50 AM  ?  Specimen: Nasopharyngeal Swab; Nasopharyngeal(NP) swabs in vial transport medium  ?Result Value Ref Range Status  ? SARS Coronavirus 2 by RT PCR NEGATIVE NEGATIVE Final  ?  Comment: (NOTE) ?SARS-CoV-2 target nucleic acids

## 2021-06-26 NOTE — H&P (Addendum)
?History and Physical  ? ? ?Patient: Kent Page [id number] DOB: 05-Apr-1990 ?DOA: 06/26/2021 ?DOS: the patient was seen and examined on 06/26/2021 ?PCP: [facility]  ?Patient coming from: Home ? ?Chief Complaint:  ?Chief Complaint  ?Patient presents with  ? Shortness of Breath  ? ?HPI: Patient is a 31 years old male with past medical history of recently diagnosed HIV was sent from ID clinic with concerns for increasing shortness of breath with hypoxia noted in the clinic with pulse ox in the 80s is in room air.  Patient was newly diagnosed of HIV/AIDs recently with a CD4 count of 29.  He was in the process of setting up treatment plan with ID   There was some concern for PJP pneumonia and the patient was sent to the hospital.  Patient states that he has been having dyspnea and shortness of breath since April which has been gradually worsening at this time.  He does have dyspnea on exertion and has mostly cough which is dry in nature with some productive cough in the morning.  Denies any fever,chills or rigor.  Denies any chest pain.  No hemoptysis.  Denies any sick contacts.  Does have a diminished appetite and has lost around 20 pounds of weight loss since April.  Denies any nausea vomiting or diarrhea.  Denies any urinary urgency frequency or dysuria.  He states that he has been smoking marijuana but denies smoking cigarettes recently.  Used to drink alcohol but has quit since March 2023. ? ?In the ED, patient was mildly hypotensive with blood pressure of 96/77.  Pulse oximetry was however 95% on room air .  Repeated test showed WBC at 9.4 with hemoglobin of 12.2.  COVID and influenza was negative.  Lactic acid was elevated at 2.4 sodium of 135 with potassium of 3.9.  Creatinine was 1.1.  LFTs were within normal limits.  Patient received 1 L of IV fluid bolus in the ED.  Pulmonary was consulted from the ED for bronchoscopy.  Patient was then considered for admission to hospital for further  evaluation and treatment. ? ?Assessment and plan ? ?Principal Problem: ?  Pneumonia ?Active Problems: ?  AIDS ([place]) ?  SOB (shortness of breath) ?  Lactate blood increased ?  ?Shortness of breath dyspnea on exertion and hypoxia.   ?Secondary to atypical pneumonia possibly PJP pneumonia.  COVID and influenza is  negative.  Patient is currently not hypoxic.  Chest x-ray showing bilateral groundglass opacities.  Patient has significantly low CD4 count with a new diagnosis of HIV/AIDS.  We will need to rule out PCP pneumonia.  Communicated with infectious disease Dr. Delaine Lame.  Recommended bronchoscopy for specimen.  Communicated with pulmonary about bronchoscopy.  CMV DNA by PCR, ABG and Fungitell has been ordered.  ABG without any hypoxia at this time.  LDH within normal limits.  High suspicion for PCP so we will start on Bactrim.  Plan for bronchoscopy.Continue bronchodilators, supplemental oxygen for now.  Closely monitor.   ID not recommending antibiotic for community-acquired pneumonia at this time.  Follow blood cultures. ? ?Mild hypotension in the ED.  Received 1 L of IV fluid bolus.  Continue to monitor. ? ?Elevated lactate.  Likely secondary to hypoxia.  We will continue to monitor ? ?Newly diagnosed HIV/ AIDS.  Absolute CD4 count of 28 with significantly elevated viral load at 421 K. from blood work 06/19/2021.  ID Dr. Delaine Lame on board.  Follow ID recommendations.  ? ?Review of Systems: As  mentioned in the history of present illness. All other systems reviewed and are negative. ?Past Medical History:  ?Diagnosis Date  ? HIV (human immunodeficiency virus infection) ([place])   ? ?History reviewed. No pertinent surgical history. ?Social History:  reports that he has quit smoking. His smoking use included cigarettes. He has been exposed to tobacco smoke. He has never used smokeless tobacco. He reports current alcohol use. He reports that he does not currently use drugs. ? ?No Known Allergies ? ?History  reviewed. No pertinent family history. ? ?Prior to Admission medications   ?Medication Sig Start Date End Date Taking? Authorizing Provider  ?levofloxacin (LEVAQUIN) 500 MG tablet Take 1 tablet (500 mg total) by mouth daily for 10 days. 06/19/21 06/29/21 Yes Fisher, Linden Dolin, PA-C  ? ? ?Physical Exam: ?Vitals:  ? 06/26/21 1200 06/26/21 1330 06/26/21 1345 06/26/21 1427  ?BP: 108/73 95/66  123/75  ?Pulse: 95 95  (!) 109  ?Resp: (!) 22 (!) 23 19 16   ?Temp:    98.9 ?F (37.2 ?C)  ?TempSrc:      ?SpO2: 95% 98% 95% 96%  ?Weight:      ?Height:      ? ?General: Thinly built not in obvious distress ?HENT:   No scleral pallor or icterus noted. Oral mucosa is moist.  ?Chest:  Diminished breath sounds bilaterally. No crackles or wheezes.  ?CVS: S1 &S2 heard. No murmur.  Regular rate and rhythm. ?Abdomen: Soft, nontender, nondistended.  Bowel sounds are heard.   ?Extremities: No cyanosis, clubbing or edema.  Peripheral pulses are palpable. ?Psych: Alert, awake and oriented, normal mood ?CNS:  No cranial nerve deficits.  Power equal in all extremities.   ?Skin: Warm and dry.  No rashes noted. ? ?Data Reviewed: ?COVID-19 Labs ? ?Recent Labs  ?  06/26/21 ?1430  ?LDH 190  ? ? ?Lab Results  ?Component Value Date  ? [place] NEGATIVE 06/26/2021  ?  ? ?  Latest Ref Rng & Units 06/26/2021  ? 11:51 AM 06/19/2021  ?  6:27 PM 06/19/2021  ?  1:18 PM  ?CBC  ?WBC 4.0 - 10.5 K/uL 9.4   6.8   7.0    ?Hemoglobin 13.0 - 17.0 g/dL 12.2   12.0   13.0    ?Hematocrit 39.0 - 52.0 % 36.8   36.3   38.9    ?Platelets 150 - 400 K/uL 606   496   494    ?  ? ?  Latest Ref Rng & Units 06/26/2021  ? 11:51 AM 06/19/2021  ?  1:18 PM  ?BMP  ?Glucose 70 - 99 mg/dL 146   99    ?BUN 6 - 20 mg/dL 13   15    ?Creatinine 0.61 - 1.24 mg/dL 1.11   1.21    ?Sodium 135 - 145 mmol/L 135   135    ?Potassium 3.5 - 5.1 mmol/L 3.9   4.3    ?Chloride 98 - 111 mmol/L 99   100    ?CO2 22 - 32 mmol/L 24   25    ?Calcium 8.9 - 10.3 mg/dL 8.5   8.5    ?  ? Advance Care Planning:   Code  Status: Full Code  ? ?Consults:  ?Pulmonary ?Infectious disease ? ?Family Communication: Communicated with the patient's brother at bedside ? ?Severity of Illness: ?The appropriate patient status for this patient is INPATIENT. Inpatient status is judged to be reasonable and necessary in order to provide the required intensity of service to  ensure the patient's safety. The patient's presenting symptoms, physical exam findings, and initial radiographic and laboratory data in the context of their chronic comorbidities is felt to place them at high risk for further clinical deterioration. Furthermore, it is not anticipated that the patient will be medically stable for discharge from the hospital within 2 midnights of admission. I certify that at the point of admission it is my clinical judgment that the patient will require inpatient hospital care spanning beyond 2 midnights from the point of admission due to high intensity of service, high risk for further deterioration and high frequency of surveillance required.* ? ?Author: ?Flora Lipps, MD ?06/26/2021 7:18 PM ? ?For on call review [email or web address].  ?

## 2021-06-26 NOTE — ED Provider Notes (Signed)
? ?[facility] ?Provider Note ? ? ? Event Date/Time  ? First MD Initiated Contact with Patient 06/26/21 1139   ?  (approximate) ? ? ?History  ? ?Shortness of Breath ? ? ?HPI ? ?Kent Page is a 31 y.o. male with recent diagnosis of HIV who was at the ID doctors today and was noted to be tachycardic and reportedly hypoxic.  Patient was sent here for admission.  Here the patient's blood pressure is 96/77 he is afebrile (temperature was 99 at the office) heart rate 99 respiratory rate of 26 O2 sat 97 here.  Patient reports an occasional cough here no other medical problems. ? ?  ? ? ?Physical Exam  ? ?Triage Vital Signs: ?ED Triage Vitals [06/26/21 1141]  ?Enc Vitals Group  ?   BP 96/77  ?   Pulse Rate 99  ?   Resp (!) 26  ?   Temp 97.6 ?F (36.4 ?C)  ?   Temp Source Oral  ?   SpO2 97 %  ?   Weight 120 lb (54.4 kg)  ?   Height 5' 6"  (1.676 m)  ?   Head Circumference   ?   Peak Flow   ?   Pain Score 0  ?   Pain Loc   ?   Pain Edu?   ?   Excl. in [place]?   ? ? ?Most recent vital signs: ?Vitals:  ? 06/26/21 1330 06/26/21 1345  ?BP: 95/66   ?Pulse: 95   ?Resp: (!) 23 19  ?Temp:    ?SpO2: 98% 95%  ? ? ? ?General: Awake, no distress.  Patient looks thin ?Mouth: No erythema or exudate ?CV:  Good peripheral perfusion.  Heart regular rate and rhythm no murmurs ?Resp:  Normal effort.  Lungs are clear ?Abd:  No distention.  Soft bowel sounds positive nontender ?Extremities no edema ? ? ?ED Results / Procedures / Treatments  ? ?Labs ?(all labs ordered are listed, but only abnormal results are displayed) ?Labs Reviewed  ?COMPREHENSIVE METABOLIC PANEL - Abnormal; Notable for the following components:  ?    Result Value  ? Glucose, Bld 146 (*)   ? Calcium 8.5 (*)   ? Albumin 2.4 (*)   ? All other components within normal limits  ?LACTIC ACID, PLASMA - Abnormal; Notable for the following components:  ? Lactic Acid, Venous 2.4 (*)   ? All other components within normal limits  ?CBC WITH DIFFERENTIAL/PLATELET -  Abnormal; Notable for the following components:  ? Hemoglobin 12.2 (*)   ? HCT 36.8 (*)   ? Platelets 606 (*)   ? Neutro Abs 7.8 (*)   ? All other components within normal limits  ?BLOOD GAS, ARTERIAL - Abnormal; Notable for the following components:  ? pH, Arterial 7.49 (*)   ? pO2, Arterial 79 (*)   ? All other components within normal limits  ?RESP PANEL BY RT-PCR (FLU A&B, COVID) [id number]  ?CULTURE, BLOOD (ROUTINE X 2)  ?CULTURE, BLOOD (ROUTINE X 2)  ?LACTIC ACID, PLASMA  ?CMV DNA BY PCR, QUALITATIVE  ?FUNGITELL, SERUM  ?LACTATE DEHYDROGENASE  ?QUANTIFERON-TB GOLD PLUS  ?HLA [id number]  ?TROPONIN I (HIGH SENSITIVITY)  ? ? ? ?EKG ? ?EKG read and interpreted by me shows normal sinus rhythm rate of 97 there is PR segment depression in lead II Q waves in 2 3 aVF and also in V5 and V6 no other obvious changes are seen. ? ? ?RADIOLOGY ? ?Chest x-ray shows bilateral  haziness consistent with possible pneumocystis pneumonia ? ?PROCEDURES: ? ?Critical Care performed: Critical care time 25 minutes.  This includes discussing the patient with ID and with the hospitalist.  Additionally I reviewed some of the old clinic notes and the last hospital visit.  I reviewed all of his labs and findings. ?I also discussed him with pulmonary doctor's with Dr. Chancy Milroy and then Dr. Laural Roes. Dr Laural Roes will be able to do the bronchoscopy ?Procedures ? ? ?MEDICATIONS ORDERED IN ED: ?Medications  ?enoxaparin (LOVENOX) injection 40 mg (has no administration in time range)  ?sodium chloride flush (NS) 0.9 % injection 3 mL (3 mLs Intravenous Not Given 06/26/21 1339)  ?sodium chloride flush (NS) 0.9 % injection 3 mL (has no administration in time range)  ?0.9 %  sodium chloride infusion (has no administration in time range)  ?acetaminophen (TYLENOL) tablet 650 mg (has no administration in time range)  ?  Or  ?acetaminophen (TYLENOL) suppository 650 mg (has no administration in time range)  ?senna-docusate (Senokot-S) tablet 1 tablet (has no  administration in time range)  ?ondansetron (ZOFRAN) tablet 4 mg (has no administration in time range)  ?  Or  ?ondansetron (ZOFRAN) injection 4 mg (has no administration in time range)  ?albuterol (PROVENTIL) (2.5 MG/3ML) 0.083% nebulizer solution 2.5 mg (has no administration in time range)  ?sodium chloride 0.9 % bolus 1,000 mL (1,000 mLs Intravenous New Bag/Given 06/26/21 1345)  ? ? ? ?IMPRESSION / MDM / ASSESSMENT AND PLAN / ED COURSE  ?I reviewed the triage vital signs and the nursing notes. ? ? ?Patient with low sats in the office somewhat hypotensive here.  Low-grade fever and nonproductive cough.  Patient with new HIV infection.  Most likely he has an pneumocystis pneumonia.  I discussed him with ID we will get him in the hospital we will try to get a bronc ASAP.  He is getting some fluids as well.  ID did not want to start him on any antibiotics until we get the bronchoscopy.  That should be happening quickly. ? ?The patient is on the cardiac monitor to evaluate for evidence of arrhythmia and/or significant heart rate changes.  None have been seen ? ? ? ?FINAL CLINICAL IMPRESSION(S) / ED DIAGNOSES  ? ?Final diagnoses:  ?Shortness of breath  ?Symptomatic HIV infection ([place])  ?Hypotension, unspecified hypotension type  ?Community acquired pneumonia, unspecified laterality  ? ?Community-acquired pneumonia is likely pneumocystis. ? ?Rx / DC Orders  ? ?ED Discharge Orders   ? ? None  ? ?  ? ? ? ?Note:  This document was prepared using Dragon voice recognition software and may include unintentional dictation errors. ?  ?Nena Polio, MD ?06/26/21 1426 ? ?

## 2021-06-26 NOTE — Progress Notes (Signed)
NAME: Kent Page  ?DOB: Sep 11, 1990  ?MRN: [id number]  ?Date/Time: 06/26/2021 10:19 AM ? ? ?Patient is here with his brother.  Patient gave me permission to speak in front of his brother  ?Kent Page is a 31 y.o. male is referred to me from emergency department as he was diagnosed with HIV. ?Patient presented to the ED last week with increasing shortness of breath of 3 weeks duration, weight loss, fatigue and poor appetite.  A chest x-ray was done and that showed perihilar infiltrates extending towards bases questioning atypical pneumonia versus noncardiogenic edema.  There was no segmental consolidation.  He had an HIV test which was positive.  ED sent CD4 count and viral load and made an appointment for the patient to see me today was also given levofloxacin for 10 days ?Patient is here today and he does not feel any better ?He is more short of breath ?He is very tired ?Does not have a cough ?Denies any fever ?He has some night sweats ?He has no diarrhea ?He has no abdominal pain ?He is got poor appetite ?He has no difficulty in swallowing ?Patient states he has been sexually active with men but not in many months ?His last HIV test was in 2016-06-04 ?His dad passed away in 2020/06/04 and patient has been moving a lot and he thought that his weight loss and fatigue was due to that ?His dad died of cirrhosis liver ? 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 ?HIV diagnosed 06/19/2021 ?Nadir Cd4 is equal to 25 ?VL 421,000 ?OI  ? ?HAARt history: Treatment na?ve ?Acquired thru sex with men ?Genotype pending ?? ?Past medical history ?None ?Past surgical history none ?Family history father died of cirrhosis of the liver ?Diabetes mellitus in grandparents ?Malignant seen on ?Social History  ? ?Socioeconomic History  ? Marital status: Single  ?  Spouse name: Not on file  ? Number of children: Not on file  ? Years of education: Not on file  ? Highest education level: Not on file  ?Occupational History  ?  Not on file  ?Tobacco Use  ? Smoking status: Former  ?  Types: Cigarettes  ?  Passive exposure: Past  ? Smokeless tobacco: Never  ?Vaping Use  ? Vaping Use: Never used  ?Substance and Sexual Activity  ? Alcohol use: Yes  ?  Comment: daily  ? Drug use: Not Currently  ? Sexual activity: Not on file  ?Other Topics Concern  ? Not on file  ?Social History Narrative  ? Not on file  ? ?Social Determinants of Health  ? ?Financial Resource Strain: Not on file  ?Food Insecurity: Not on file  ?Transportation Needs: Not on file  ?Physical Activity: Not on file  ?Stress: Not on file  ?Social Connections: Not on file  ?Intimate Partner Violence: Not on file  ?  ? ?No Known Allergies ?? ?Current Outpatient Medications  ?Medication Sig Dispense Refill  ? levofloxacin (LEVAQUIN) 500 MG tablet Take 1 tablet (500 mg total) by mouth daily for 10 days. 10 tablet 0  ? ?No current facility-administered medications for this visit.  ?  ?REVIEW OF SYSTEMS:  ?Const: negative fever, negative chills, ++weight loss ?Eyes: negative diplopia or visual changes, negative eye pain ?ENT: negative coryza, negative sore throat ?Resp: negative cough, hemoptysis, +++dyspnea ?Cards: negative for chest pain, palpitations, lower extremity edema ?GU: negative for frequency, dysuria and hematuria ?Skin: negative for rash and pruritus ?Heme: negative for easy bruising and gum/nose bleeding ?  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 ?Objective:  ?VITALS:  ?BP 103/71   Pulse (!) 113   Temp (!) 97.5 ?F (36.4 ?C) (Temporal)   Ht 5\' 6"  (1.676 m)   Wt 117 lb (53.1 kg)   BMI 18.88 kg/m?  ? ?Pulse ox was 88 on room air at rest.  It improved after the facemask was removed and I was asked to take deep breath.  It went up to 92% ?It dropped to 84 on walking ? ?PHYSICAL EXAM:  ?General: Alert, cooperative, chronically ill ?Emaciated head:  Normocephalic, without obvious abnormality, atraumatic. ?Eyes: Conjunctivae clear, anicteric sclerae. Pupils are equal ?Nose: Nares normal. No drainage or sinus tenderness. ?Throat: Lips, mucosa, tongue coated neck: Supple, symmetrical, no adenopathy, thyroid: non tender ?no carotid bruit and no JVD. ?Back: No CVA tenderness. ?Lungs: Bilateral air entry. ?Few crepitations sign in the bases ?Heart: Tachycardia abdomen: Soft, non-tender,not distended. Bowel sounds normal. No masses ?Extremities: Extremities normal, atraumatic, cyanosis nails. No edema. No clubbing ?Skin: No rashes or lesions. Not Jaundiced ?Lymph: Cervical, supraclavicular normal. ?Neurologic: Grossly non-focal ?Pertinent Labs ?CD4 28 with 2.5%.  Viral load 421,000 ?Health maintenance ?Vaccination ? ?Vaccine Date last given comment  ?Influenza    ?Hepatitis B    ?Hepatitis A    ?Prevnar-PCV-13    ?Pneumovac-PPSV-23    ?TdaP    ?HPV    ?Shingrix ( zoster vaccine)    ? ?______________________ ? ?Labs ?Lab Result  Date comment  ?HIV VL     ?CD4     ?Genotype     ?     ?HIV antibody     ?RPR     ?Quantiferon Gold     ?Hep C ab     ?Hepatitis B-ab,ag,c     ?Hepatitis A-IgM, IgG /T     ?Lipid     ?GC/CHL     ?PAP     ?HB,PLT,Cr, LFT     ? ? ?Preventive  ?Procedure Result  Date comment  ?colonoscopy     ?Mammogram     ?Dental exam     ?Opthal     ? ? 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 ?Dyspnea with hypoxia ?Rule out PCP ?Patient needs hospitalization because his sats are 88% room air and desaturates to 84% on walking ?It improved after deep breathing  ?He will have repeat x-ray ?Will need pulmonary consult for bronchoscopy to rule out PCP, CMV, [place] ?If pulmonary is not able to do bronc by tomorrow then patient will be empirically started on IV Bactrim and prednisone ?He needs ABG ?As pulse ox is overestimating his oxygen status ?Fungitell will be sent ?CMV PCR to be  sent ? ? ?? ?___________________________________________________ ?Discussed with patient, and his brother. ?Discussed with Dr. [id number] in the ED. ?

## 2021-06-27 DIAGNOSIS — B20 Human immunodeficiency virus [HIV] disease: Secondary | ICD-10-CM | POA: Diagnosis not present

## 2021-06-27 DIAGNOSIS — E43 Unspecified severe protein-calorie malnutrition: Secondary | ICD-10-CM

## 2021-06-27 DIAGNOSIS — B59 Pneumocystosis: Secondary | ICD-10-CM | POA: Diagnosis not present

## 2021-06-27 DIAGNOSIS — R0602 Shortness of breath: Secondary | ICD-10-CM | POA: Diagnosis not present

## 2021-06-27 DIAGNOSIS — R7989 Other specified abnormal findings of blood chemistry: Secondary | ICD-10-CM | POA: Diagnosis not present

## 2021-06-27 DIAGNOSIS — J189 Pneumonia, unspecified organism: Secondary | ICD-10-CM | POA: Diagnosis not present

## 2021-06-27 LAB — CRYPTOCOCCAL ANTIGEN: Crypto Ag: NEGATIVE

## 2021-06-27 LAB — RESPIRATORY PANEL BY PCR

## 2021-06-27 LAB — CBC
HCT: 33.4 % — ABNORMAL LOW (ref 39.0–52.0)
Hemoglobin: 11.1 g/dL — ABNORMAL LOW (ref 13.0–17.0)
MCH: 28.4 pg (ref 26.0–34.0)
MCHC: 33.2 g/dL (ref 30.0–36.0)
MCV: 85.4 fL (ref 80.0–100.0)
Platelets: 591 10*3/uL — ABNORMAL HIGH (ref 150–400)
RBC: 3.91 MIL/uL — ABNORMAL LOW (ref 4.22–5.81)
RDW: 12.2 % (ref 11.5–15.5)
WBC: 6.4 10*3/uL (ref 4.0–10.5)
nRBC: 0 % (ref 0.0–0.2)

## 2021-06-27 LAB — HEPATITIS C ANTIBODY: HCV Ab: NONREACTIVE

## 2021-06-27 LAB — COMPREHENSIVE METABOLIC PANEL
ALT: 29 U/L (ref 0–44)
AST: 29 U/L (ref 15–41)
Albumin: 2.1 g/dL — ABNORMAL LOW (ref 3.5–5.0)
Alkaline Phosphatase: 44 U/L (ref 38–126)
Anion gap: 8 (ref 5–15)
BUN: 12 mg/dL (ref 6–20)
CO2: 22 mmol/L (ref 22–32)
Calcium: 8.6 mg/dL — ABNORMAL LOW (ref 8.9–10.3)
Chloride: 107 mmol/L (ref 98–111)
Creatinine, Ser: 1.04 mg/dL (ref 0.61–1.24)
GFR, Estimated: >60 mL/min (ref >60)
Glucose, Bld: 126 mg/dL — ABNORMAL HIGH (ref 70–99)
Potassium: 4.9 mmol/L (ref 3.5–5.1)
Sodium: 137 mmol/L (ref 135–145)
Total Bilirubin: 0.2 mg/dL — ABNORMAL LOW (ref 0.3–1.2)
Total Protein: 7 g/dL (ref 6.5–8.1)

## 2021-06-27 LAB — BODY FLUID CELL COUNT WITH DIFFERENTIAL
Eos, Fluid: 0 %
Lymphs, Fluid: 14 %
Monocyte-Macrophage-Serous Fluid: 53 % (ref 50–90)
Neutrophil Count, Fluid: 33 % — ABNORMAL HIGH (ref 0–25)
Total Nucleated Cell Count, Fluid: 17 cu mm (ref 0–1000)

## 2021-06-27 LAB — HEPATITIS B SURFACE ANTIGEN: Hepatitis B Surface Ag: NONREACTIVE

## 2021-06-27 LAB — CORTISOL-AM, BLOOD: Cortisol - AM: 7.1 ug/dL (ref 6.7–22.6)

## 2021-06-27 LAB — HEPATITIS A ANTIBODY, TOTAL: hep A Total Ab: NONREACTIVE

## 2021-06-27 LAB — RPR: RPR Ser Ql: NONREACTIVE

## 2021-06-27 LAB — PROCALCITONIN: Procalcitonin: <0.1 ng/mL

## 2021-06-27 LAB — PROTIME-INR
INR: 1.2 (ref 0.8–1.2)
Prothrombin Time: 14.7 seconds (ref 11.4–15.2)

## 2021-06-27 LAB — HEPATITIS B CORE ANTIBODY, TOTAL: Hep B Core Total Ab: NONREACTIVE

## 2021-06-27 LAB — MRSA NEXT GEN BY PCR, NASAL: MRSA by PCR Next Gen: NOT DETECTED

## 2021-06-27 MED ORDER — ROCURONIUM BROMIDE 10 MG/ML (PF) SYRINGE
PREFILLED_SYRINGE | INTRAVENOUS | Status: AC
  Administered 2021-06-27: 40 mg via INTRAVENOUS
  Filled 2021-06-27: qty 10

## 2021-06-27 MED ORDER — ROCURONIUM BROMIDE 50 MG/5ML IV SOLN
40.0000 mg | Freq: Once | INTRAVENOUS | Status: AC
  Filled 2021-06-27: qty 4

## 2021-06-27 MED ORDER — FENTANYL 2500MCG IN NS 250ML (10MCG/ML) PREMIX INFUSION
0.0000 ug/h | INTRAVENOUS | Status: DC
  Administered 2021-06-27: 200 ug/h via INTRAVENOUS
  Filled 2021-06-27: qty 250

## 2021-06-27 MED ORDER — SODIUM CHLORIDE 0.9 % IV SOLN
250.0000 mL | INTRAVENOUS | Status: DC
Start: 2021-06-27 — End: 2021-07-04
  Administered 2021-06-28: 250 mL via INTRAVENOUS

## 2021-06-27 MED ORDER — MIDAZOLAM HCL 2 MG/2ML IJ SOLN
4.0000 mg | Freq: Once | INTRAMUSCULAR | Status: AC
  Administered 2021-06-27: 4 mg via INTRAVENOUS
  Filled 2021-06-27: qty 4

## 2021-06-27 MED ORDER — NOREPINEPHRINE 4 MG/250ML-% IV SOLN
2.0000 ug/min | INTRAVENOUS | Status: DC
  Filled 2021-06-27: qty 250

## 2021-06-27 MED ORDER — CHLORHEXIDINE GLUCONATE CLOTH 2 % EX PADS
6.0000 | MEDICATED_PAD | Freq: Every day | CUTANEOUS | Status: DC
  Administered 2021-06-27 – 2021-06-30 (×4): 6 via TOPICAL

## 2021-06-27 MED ORDER — FENTANYL CITRATE (PF) 100 MCG/2ML IJ SOLN
100.0000 ug | Freq: Once | INTRAMUSCULAR | Status: AC
  Administered 2021-06-27: 100 ug via INTRAVENOUS
  Filled 2021-06-27: qty 2

## 2021-06-27 NOTE — Progress Notes (Signed)
?Progress Note ? ? ?Patient: Kent Page [id number]:[id number] DOB: 13-Sep-1990 DOA: 06/26/2021     1 ?DOS: the patient was seen and examined on 06/27/2021 ?  ?Brief hospital course: ?Patient is a 31 years old male with past medical history of recently diagnosed HIV was sent from ID clinic with concerns for increasing shortness of breath with hypoxia noted in the clinic with pulse ox in the 80s is in room air.  Patient was newly diagnosed of HIV/AIDs recently with a CD4 count of 29.  He was in the process of setting up treatment plan with ID   There was some concern for PJP pneumonia and the patient was sent to the hospital.  Patient states that he has been having dyspnea and shortness of breath since April which has been gradually worsening at this time.  He does have dyspnea on exertion and has mostly cough which is dry in nature with some productive cough in the morning.  Denies any fever,chills or rigor.  Denies any chest pain.  No hemoptysis.  Denies any sick contacts.  Does have a diminished appetite and has lost around 20 pounds of weight loss since April.  Denies any nausea vomiting or diarrhea.  Denies any urinary urgency frequency or dysuria.  He states that he has been smoking marijuana but denies smoking cigarettes recently.  Used to drink alcohol but has quit since March 2023. ? ?In the ED, patient was mildly hypotensive with blood pressure of 96/77.  Pulse oximetry was however 95% on room air .  Repeated test showed WBC at 9.4 with hemoglobin of 12.2.  COVID and influenza was negative.  Lactic acid was elevated at 2.4 sodium of 135 with potassium of 3.9.  Creatinine was 1.1.  LFTs were within normal limits.  Patient received 1 L of IV fluid bolus in the ED.  Pulmonary was consulted from the ED for bronchoscopy.  Patient was then considered for admission to hospital for further evaluation and treatment. ? ?5/17: Patient was waiting for bronchoscopy when seen today.  He was very concerned about not starting  HIV treatment early. ? ? ?Assessment and Plan: ?* Pneumonia ?Concern of atypical pneumonia possibly PGP pneumonia, with recent diagnosis of ARDS.  No hypoxia.  COVID-19 and influenza PCR negative.  Chest x-ray with bilateral groundglass opacities.  Respiratory viral panel negative.  Blood cultures remain negative. ?ID is on board and they requested bronchoscopy with BAL which was done by pulmonology today.  Patient tolerated the procedure well.  Pending labs. ?-Continue with IV Bactrim ?-Follow-up BAL labs ? ?AIDS ([place]) ?New diagnosis of AIDS.Absolute CD4 count of 28 with significantly elevated viral load at 421 K. from blood work 06/19/2021.  ID Dr. Delaine Lame on board.  ?Chlamydia and gonorrhea are negative.  RPR negative, cryptococcal antigen negative.  Hepatitis panel negative. ?-ID is planning to start HAART from next week ? ?SOB (shortness of breath) ?Most likely secondary to pneumonia.  No hypoxia. ?-Continue to monitor ?-Treat as above ? ?Lactate blood increased ?Resolved. ? ?Protein-calorie malnutrition, severe ([place]) ?Estimated body mass index is 19.57 kg/m? as calculated from the following: ?  Height as of this encounter: _0  (1.676 m). ?  Weight as of this encounter: 55 kg.  ? ?-Dietitian consult ? ?Subjective: Patient was seen and examined today.  Denies any pain or shortness of breath.  He was very concerned about his HIV medications, as ID was recommending starting from next week. ? ?Physical Exam: ?Vitals:  ? 06/27/21 1411 06/27/21 1415  06/27/21 1420 06/27/21 1430  ?BP:  116/85 109/80 102/77  ?Pulse: (!) 110 (!) 101 100 91  ?Resp: (!) 34 (!) 26 (!) 29 (!) 26  ?Temp:      ?TempSrc:      ?SpO2: (!) 87% 91% 91% 95%  ?Weight:      ?Height:      ? ?General.  Malnourished gentleman, in no acute distress. ?Pulmonary.  Lungs clear bilaterally, normal respiratory effort. ?CV.  Regular rate and rhythm, no JVD, rub or murmur. ?Abdomen.  Soft, nontender, nondistended, BS positive. ?CNS.  Alert and oriented .  No  focal neurologic deficit. ?Extremities.  No edema, no cyanosis, pulses intact and symmetrical. ?Psychiatry.  Judgment and insight appears normal. ? ?Data Reviewed: ?Prior notes, labs and images reviewed ? ?Family Communication: Discussed with patient ? ?Disposition: ?Status is: Inpatient ?Remains inpatient appropriate because: Severity of illness ? ? Planned Discharge Destination: Home ? ?DVT prophylaxis.  Lovenox ?Time spent: 50 minutes ? ?This record has been created using Systems analyst. Errors have been sought and corrected,but may not always be located. Such creation errors do not reflect on the standard of care. ? ?Author: ?Lorella Nimrod, MD ?06/27/2021 3:07 PM ? ?For on call review [email or web address].  ?

## 2021-06-27 NOTE — Assessment & Plan Note (Signed)
Most likely secondary to pneumonia.  No hypoxia. ?-Continue to monitor ?-Treat as above ?

## 2021-06-27 NOTE — Procedures (Signed)
?PROCEDURE: BRONCHOSCOPY ?Therapeutic Aspiration of Tracheobronchial Tree and Bronchoalveolar lavage ? ?PROCEDURE DATE: 06/27/2021  TIME:  ?NAME:  Kent Page  DOB:29-Aug-1990  MRN: [id number] ?LOC:  IC19A/IC19A-AA    HOSP DAY: _0 @ ?CODE STATUS:   ? ?  ?Code Status Orders  ?(From admission, onward)  ?  ? ? ?  ? ?  Start     Ordered  ? 06/26/21 1253  Full code  Continuous       ? 06/26/21 1254  ? ?  ?  ? ?  ? ?Code Status History   ? ? This patient has a current code status but no historical code status.  ? ?  ?   ? ? ? ?Indications/Preliminary Diagnosis:  ? ?Consent: (Place X beside choice/s below) ? The benefits, risks and possible complications of the procedure were        explained to: ? __x_ patient ? ___ patient's family ? ___ other:___________  ?who verbalized understanding and gave: ? __x_ verbal ? ___ written ? ___ verbal and written ? ___ telephone ? ___ other:________ consent.  ?  ?  Unable to obtain consent; procedure performed on emergent basis.   ?  Other:   ? ? ?  ?PRESEDATION ASSESSMENT: History and Physical has been performed. Patient meds and allergies have been reviewed. Presedation airway examination has been performed and documented. Baseline vital signs, sedation score, oxygenation status, and cardiac rhythm were reviewed. Patient was deemed to be in satisfactory condition to undergo the procedure.  ? ? ?PREMEDICATIONS:  ? Sedative/Narcotic Amt Dose  ? Versed 4 mg  ? Fentanyl 100 mcg  ?Diprivan  mg  ?    ? ? ? ? ? ? ?PROCEDURE DETAILS: Timeout performed and correct patient, name, & ID confirmed. Following prep per Pulmonary policy, appropriate sedation was administered. The Bronchoscope was inserted in to oral cavity with bite block in place. Therapeutic aspiration of Tracheobronchial tree was performed.  Airway exam proceeded with findings, technical procedures, and specimen collection as noted below. At the end of exam the scope was withdrawn without incident. Impression and  Plan as noted below.  ? ? ? ? ? ? ? ? ? Airway Prep (Place X beside choice below)  ? 1% Transtracheal Lidocaine Anesthetization 7 cc  ? Patient prepped per Bronchoscopy Lab Policy  ?   ? ? Insertion Route (Place X beside choice below)  ? Nasal  ? Oral  ?x Endotracheal Tube  ? Tracheostomy  ? ?INTRAPROCEDURE MEDICATIONS: ? Sedative/Narcotic Amt Dose  ? Versed  mg  ? Fentanyl 50 mcg  ?Diprivan  mg  ?    ? ?Medication Amt Dose  Medication Amt Dose  ?Lidocaine 1%  cc  Epinephrine 1:10,000 sol  cc  ?Xylocaine 4%  cc  Cocaine  cc  ? ?TECHNICAL PROCEDURES: (Place X beside choice below) ?  Procedures  Description  ?  None   ?  Electrocautery   ?  Cryotherapy   ?  Balloon Dilatation   ?  Bronchography   ?  Stent Placement   ?x  Therapeutic Aspiration Right lower lobe  ?  Laser/Argon Plasma   ? Brachytherapy Catheter Placement   ? Foreign Body Removal    ? ? ? ? ? ?SPECIMENS (Sites): (Place X beside choice below) ? Specimens Description  ? No Specimens Obtained    ? Washings   ?x Lavage RML and RLL  ? Biopsies   ? Fine Needle Aspirates   ? Brushings   ?  Sputum   ? ?FINDINGS:  ?ESTIMATED BLOOD LOSS: none ?COMPLICATIONS/RESOLUTION: none ? ? ? ? ? ?IMPRESSION:POST-PROCEDURE DX:  ? ? ? ?RECOMMENDATION/PLAN:  ? ?Awaiting microbiology  ? ? ? ? ?Ottie Glazier, M.D.  ?Pulmonary & Critical Care Medicine  ?[place]  ? ? ? ?

## 2021-06-27 NOTE — Progress Notes (Signed)
Patient transferred to ICU for his brocchoscopy ?

## 2021-06-27 NOTE — Progress Notes (Signed)
? ?Date of Admission:  06/26/2021    ? ? ?ID: Kent Page is a 31 y.o. male Principal Problem: ?  Pneumonia ?Active Problems: ?  AIDS ([place]) ?  SOB (shortness of breath) ?  Lactate blood increased ? ? ? ?Subjective: ?Underwent bronchoscopy today ?Was  intubated for the procedure and extubated and out of ICU ?Doing fine ? ?Medications:  ? Chlorhexidine Gluconate Cloth  6 each Topical Daily  ? enoxaparin (LOVENOX) injection  40 mg Subcutaneous Q24H  ? fentaNYL (SUBLIMAZE) injection  100 mcg Intravenous Once  ? midazolam  4 mg Intravenous Once  ? pantoprazole  40 mg Oral Daily  ? rocuronium  40 mg Intravenous Once  ? rocuronium bromide      ? sodium chloride flush  3 mL Intravenous Q12H  ? ? ?Objective: ?Vital signs in last 24 hours: ?Temp:  [97.5 ?F (36.4 ?C)-100.3 ?F (37.9 ?C)] 97.5 ?F (36.4 ?C) (05/17 4098) ?Pulse Rate:  [11-914] 76 (05/17 0748) ?Resp:  [16-23] 20 (05/17 0528) ?BP: (94-123)/(59-76) 103/69 (05/17 7829) ?SpO2:  [94 %-98 %] 94 % (05/17 0748) ?Weight:  [55 kg] 55 kg (05/17 0500) ? ? ?PHYSICAL EXAM:  ?General: Alert, cooperative, no distress, appears stated age.  ?Head: Normocephalic, without obvious abnormality, atraumatic. ?Eyes: Conjunctivae clear, anicteric sclerae. Pupils are equal ?ENT Nares normal. No drainage or sinus tenderness. ?Lips, mucosa, and tongue normal. No Thrush ?Neck: Supple, symmetrical, no adenopathy, thyroid: non tender ?no carotid bruit and no JVD. ?Back: No CVA tenderness. ?Lungs: Clear to auscultation bilaterally. No Wheezing or Rhonchi. No rales. ?Heart: Regular rate and rhythm, no murmur, rub or gallop. ?Abdomen: Soft, non-tender,not distended. Bowel sounds normal. No masses ?Extremities: atraumatic, no cyanosis. No edema. No clubbing ?Skin: No rashes or lesions. Or bruising ?Lymph: Cervical, supraclavicular normal. ?Neurologic: Grossly non-focal ? ?Lab Results ?Recent Labs  ?  06/26/21 ?1151 06/27/21 ?0429 06/27/21 ?0613  ?WBC 9.4  --  6.4  ?HGB 12.2*  --  11.1*  ?HCT  36.8*  --  33.4*  ?NA 135 137  --   ?K 3.9 4.9  --   ?CL 99 107  --   ?CO2 24 22  --   ?BUN 13 12  --   ?CREATININE 1.11 1.04  --   ? ?Liver Panel ?Recent Labs  ?  06/26/21 ?1151 06/27/21 ?0429  ?PROT 7.4 7.0  ?ALBUMIN 2.4* 2.1*  ?AST 35 29  ?ALT 29 29  ?ALKPHOS 50 44  ?BILITOT 0.5 0.2*  ?Microbiology: ?BAL cell count 17 ( 53% M, 145 L ?BC NG ?Studies/Results: ?DG Chest 2 View ? ?Result Date: 06/26/2021 ?CLINICAL DATA:  Shortness of breath.  Recently diagnosed with HIV. EXAM: CHEST - 2 VIEW COMPARISON:  Radiographs 06/19/2021 and 11/21/2017. FINDINGS: The heart size and mediastinal contours are stable without evidence of adenopathy. There is no pleural effusion or pneumothorax. Interval increased ground-glass opacities throughout both lungs suspicious for atypical pneumonia. No consolidation or focal nodularity apparent. The bones appear unremarkable. Telemetry leads overlie the chest. IMPRESSION: Progressive ground-glass opacities throughout both lungs suspicious for atypical pneumonia. No significant pleural effusion. Electronically Signed   By: Richardean Sale M.D.   On: 06/26/2021 12:27   ? ? ?Assessment/Plan: ?31 yr male presenting with sob, fatigue and weight loss of more than 3 weeks ?Diagnosed HIV on 06/19/21, Cd4 is 28 and Vl 421K ?Pt has advanced aids with progressive dyspnea  with b/l GGO in CXR which is progressing compared to a week- no improvement with levaquin which he has  been taking from last week ?Hypoxia with desaturation on exercise ?ABG shows hypoxia  ?D.D - PJP  ( previously called PCP) ?[place] ?Or other Opportunistic infections like CMV pneumonitis ( no fever) ? bronchoscopy done and BAL sent for different tests ? ?fungitell ( beta D glucan)-result pending ?CMV PCR-Pending ?Toxo, crypto, hepatitis profile-neg ?RESp panel PCR-neg ?  ?On Iv bactrim and PO prednisone ?Can be changed to PO soon ?HAART can be started in 24-48 hrs ?  ?Weight loss due to AIDS ?  ?Hypoalbuminemia due to AIDS ? ?Appreciate  Dr.Aleskerov 's consult and bronch ?Discussed the management with the patient ?  ?

## 2021-06-27 NOTE — Progress Notes (Signed)
Patient returned from bronchoscopy. BP low. Dr Nelson Chimes notified. Order received for regular diet ?

## 2021-06-27 NOTE — Assessment & Plan Note (Addendum)
New diagnosis of AIDS.Absolute CD4 count of 28 with significantly elevated viral load at 421 K. from blood work 06/19/2021.  ID Dr. Delaine Lame on board.  ?Chlamydia and gonorrhea are negative.  RPR negative, cryptococcal antigen negative.  Hepatitis panel negative. ?-ID is planning to start HAART from next week ?

## 2021-06-27 NOTE — Assessment & Plan Note (Signed)
Resolved

## 2021-06-27 NOTE — Assessment & Plan Note (Signed)
Concern of atypical pneumonia possibly PGP pneumonia, with recent diagnosis of ARDS.  No hypoxia.  COVID-19 and influenza PCR negative.  Chest x-ray with bilateral groundglass opacities.  Respiratory viral panel negative.  Blood cultures remain negative. ?ID is on board and they requested bronchoscopy with BAL which was done by pulmonology today.  Patient tolerated the procedure well.  Pending labs. ?-Continue with IV Bactrim ?-Follow-up BAL labs ?

## 2021-06-27 NOTE — TOC Initial Note (Signed)
Transition of Care (TOC) - Initial/Assessment Note  ? ? ?Patient Details  ?Name: Meril Pelino ?MRN: [id number] ?Date of Birth: February 27, 1990 ? ?Transition of Care (TOC) CM/SW Contact:    ?Beverly Sessions, RN ?Phone Number: ?06/27/2021, 10:14 AM ? ?Clinical Narrative:                 ? ? ?Transition of Care (TOC) Screening Note ? ? ?Patient Details  ?Name: Nima Sill ?Date of Birth: 10-30-1990 ? ? ?Transition of Care (TOC) CM/SW Contact:    ?Beverly Sessions, RN ?Phone Number: ?06/27/2021, 10:14 AM ? ? ? ?Transition of Care Department [facility]) has reviewed patient and no TOC needs have been identified at this time. We will continue to monitor patient advancement through interdisciplinary progression rounds. If new patient transition needs arise, please place a TOC consult. ? ? ?  ?  ? ? ?Patient Goals and CMS Choice ?  ?  ?  ? ?Expected Discharge Plan and Services ?  ?  ?  ?  ?  ?                ?  ?  ?  ?  ?  ?  ?  ?  ?  ?  ? ?Prior Living Arrangements/Services ?  ?  ?  ?       ?  ?  ?  ?  ? ?Activities of Daily Living ?Home Assistive Devices/Equipment: None ?ADL Screening (condition at time of admission) ?Patient's cognitive ability adequate to safely complete daily activities?: Yes ?Is the patient deaf or have difficulty hearing?: No ?Does the patient have difficulty seeing, even when wearing glasses/contacts?: No ?Does the patient have difficulty concentrating, remembering, or making decisions?: No ?Patient able to express need for assistance with ADLs?: Yes ?Does the patient have difficulty dressing or bathing?: No ?Independently performs ADLs?: Yes (appropriate for developmental age) ?Does the patient have difficulty walking or climbing stairs?: No ?Weakness of Legs: None ?Weakness of Arms/Hands: None ? ?Permission Sought/Granted ?  ?  ?   ?   ?   ?   ? ?Emotional Assessment ?  ?  ?  ?  ?  ?  ? ?Admission diagnosis:  Shortness of breath [R06.02] ?Pneumonia [J18.9] ?Symptomatic HIV infection ([place])  [B20] ?Hypotension, unspecified hypotension type [I95.9] ?Patient Active Problem List  ? Diagnosis Date Noted  ? AIDS ([place]) 06/26/2021  ? SOB (shortness of breath) 06/26/2021  ? Lactate blood increased 06/26/2021  ? Pneumonia 06/26/2021  ? ?PCP:  [facility] ?Pharmacy:   ?[facility] [id number] [place] [address] ?[place] ?[place] [address] ?Phone: [phone] Fax: [phone] ? ? ? ? ?Social Determinants of Health (SDOH) Interventions ?  ? ?Readmission Risk Interventions ?   ? View : No data to display.  ?  ?  ?  ? ? ? ?

## 2021-06-27 NOTE — Progress Notes (Signed)
? ? ? ?PULMONOLOGY ? ? ? ? ? ? ? ? ?Date: 06/27/2021,   ?MRN# [id number] Efosa Treichler 1990/04/24 ? ? ?  ?AdmissionWeight: 54.4 kg                 ?CurrentWeight: 55 kg ? ?Referring provider: Dr. Rip Harbour ? ? ?CHIEF COMPLAINT:  ? 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 ? ?HISTORY OF PRESENT ILLNESS  ? ?31 year old patient with newly diagnosed HIV/AIDS coming in from infectious disease due to worsening tachycardia and hypoxemia.  Recent CD4 count of 29.  Was setting up plan with infectious disease for AIDS therapy.  Had concerns for PJP pneumonia and was sent to the hospital to evaluate for this.  Reports ongoing progressive shortness of breath from April denies sick contacts does admit to productive cough denies fevers chills hemoptysis has been anorexic with over 20 pound weight loss in the last few months.  Generally smokes marijuana and cigarettes daily does use alcohol but declines if he is with alcoholism has not had seizures in the past.  In the ED was noted to be hypotensive tachycardic with hypoxemia on mild exertion mild AKI with creatinine 1.1 otherwise electrolytes are fairly preserved received resuscitative fluids pulmonary consultation for bronchoscopic airway inspection and microbiology with bronchoalveolar lavage. ? ?06/27/21- met with patient explained planned procedure , he consents to endotracheal intubation and bronchoscopy with BAL and planned airway inspection with therapeutic aspiration of tracheobronchial tree. Patient is agreeable.  Reviewed risks/complications and benefits with patient, risks include infection, pneumothorax/pneumomediastinum which may require chest tube placement as well as overnight/prolonged hospitalization and possible mechanical ventilation. Other risks include bleeding and very rarely death.  Patient understands risks and wishes to proceed.  Additional questions were answered, and patient is aware that post procedure patient will be going home with  family and may experience cough with possible clots on expectoration as well as phlegm which may last few days as well as hoarseness of voice post intubation and mechanical ventilation. ? ? ? ?PAST MEDICAL HISTORY  ? ?Past Medical History:  ?Diagnosis Date  ? HIV (human immunodeficiency virus infection) ([place])   ? ? ? ?SURGICAL HISTORY  ? ?History reviewed. No pertinent surgical history. ? ? ?FAMILY HISTORY  ? ?History reviewed. No pertinent family history. ? ? ?SOCIAL HISTORY  ? ?Social History  ? ?Tobacco Use  ? Smoking status: Former  ?  Types: Cigarettes  ?  Passive exposure: Past  ? Smokeless tobacco: Never  ?Vaping Use  ? Vaping Use: Never used  ?Substance Use Topics  ? Alcohol use: Yes  ?  Comment: daily  ? Drug use: Not Currently  ? ? ? ?MEDICATIONS  ? ? ?Home Medication:  ? ?  ?Current Medication: ? ?Current Facility-Administered Medications:  ?  0.9 %  sodium chloride infusion, 250 mL, Intravenous, PRN, Pokhrel, Laxman, MD ?  acetaminophen (TYLENOL) tablet 650 mg, 650 mg, Oral, Q6H PRN **OR** acetaminophen (TYLENOL) suppository 650 mg, 650 mg, Rectal, Q6H PRN, Pokhrel, Laxman, MD ?  albuterol (PROVENTIL) (2.5 MG/3ML) 0.083% nebulizer solution 2.5 mg, 2.5 mg, Nebulization, Q4H PRN, Pokhrel, Laxman, MD ?  enoxaparin (LOVENOX) injection 40 mg, 40 mg, Subcutaneous, Q24H, Pokhrel, Laxman, MD, 40 mg at 06/26/21 2016 ?  ondansetron (ZOFRAN) tablet 4 mg, 4 mg, Oral, Q6H PRN **OR** ondansetron (ZOFRAN) injection 4 mg, 4 mg, Intravenous, Q6H PRN, Pokhrel, Laxman, MD ?  pantoprazole (PROTONIX) EC tablet 40 mg, 40 mg, Oral, Daily, Pokhrel, Laxman, MD, 40 mg at 06/26/21 1803 ?  senna-docusate (Senokot-S) tablet 1 tablet, 1 tablet, Oral, QHS PRN, Pokhrel, Laxman, MD ?  sodium chloride flush (NS) 0.9 % injection 3 mL, 3 mL, Intravenous, Q12H, Pokhrel, Laxman, MD, 3 mL at 06/26/21 2015 ?  sodium chloride flush (NS) 0.9 % injection 3 mL, 3 mL, Intravenous, PRN, Pokhrel, Laxman, MD ?  sulfamethoxazole-trimethoprim (BACTRIM)  320 mg in dextrose 5 % 500 mL IVPB, 320 mg, Intravenous, Q8H, Zeigler, Sandi Mealy, RPH, Last Rate: 346.7 mL/hr at 06/27/21 0525, 320 mg at 06/27/21 0525 ? ? ? ?ALLERGIES  ? ?Patient has no known allergies. ? ? ? ? ?REVIEW OF SYSTEMS  ? ? ?Review of Systems: ? ?Gen:  Denies  fever, sweats, chills weigh loss  ?HEENT: Denies blurred vision, double vision, ear pain, eye pain, hearing loss, nose bleeds, sore throat ?Cardiac:  No dizziness, chest pain or heaviness, chest tightness,edema ?Resp:   reports dyspnea chronically  ?Gi: Denies swallowing difficulty, stomach pain, nausea or vomiting, diarrhea, constipation, bowel incontinence ?Gu:  Denies bladder incontinence, burning urine ?Ext:   Denies Joint pain, stiffness or swelling ?Skin: Denies  skin rash, easy bruising or bleeding or hives ?Endoc:  Denies polyuria, polydipsia , polyphagia or weight change ?Psych:   Denies depression, insomnia or hallucinations  ? ?Other:  All other systems negative ? ? ?VS: BP 103/69 (BP Location: Right Arm)   Pulse 76   Temp (!) 97.5 ?F (36.4 ?C) (Oral)   Resp 20   Ht _0  (1.676 m)   Wt 55 kg   SpO2 94%   BMI 19.57 kg/m?   ? ? ? ?PHYSICAL EXAM  ? ? ?GENERAL:NAD, no fevers, chills, no weakness no fatigue ?HEAD: Normocephalic, atraumatic.  ?EYES: Pupils equal, round, reactive to light. Extraocular muscles intact. No scleral icterus.  ?MOUTH: Moist mucosal membrane. Dentition intact. No abscess noted.  ?EAR, NOSE, THROAT: Clear without exudates. No external lesions.  ?NECK: Supple. No thyromegaly. No nodules. No JVD.  ?PULMONARY: decreased breath sounds with mild rhonchi worse at bases bilaterally.  ?CARDIOVASCULAR: S1 and S2. Regular rate and rhythm. No murmurs, rubs, or gallops. No edema. Pedal pulses 2+ bilaterally.  ?GASTROINTESTINAL: Soft, nontender, nondistended. No masses. Positive bowel sounds. No hepatosplenomegaly.  ?MUSCULOSKELETAL: No swelling, clubbing, or edema. Range of motion full in all extremities.  ?NEUROLOGIC:  Cranial nerves II through XII are intact. No gross focal neurological deficits. Sensation intact. Reflexes intact.  ?SKIN: No ulceration, lesions, rashes, or cyanosis. Skin warm and dry. Turgor intact.  ?PSYCHIATRIC: Mood, affect within normal limits. The patient is awake, alert and oriented x 3. Insight, judgment intact.  ? ? ?  ? ?IMAGING  ? ?_1 @  ? ?ASSESSMENT/PLAN  ? ?Acute hypoxemic respiratory failure ?Patient with active AIDS and is under the care of infectious disease team in process of receiving therapy ?-Status post ID evaluation-appreciate input ?-There is recommendation for bronchoscopic evaluation with microbiology to be sent for KS/PJP/CMV ?-Patient reports some improvement since hospitalization and having resuscitative fluids ? ? ?Centrilobular emphysema ?-Active tobacco and THC abuse.  Smoking ?  ? ? ?AIDS ?CD4 less than 50 ?-Infectious disease on case-appreciate input ? ? ? ?Thank you for allowing me to participate in the care of this patient.  ? ?Patient/Family are satisfied with care plan and all questions have been answered.  ? ? ?Provider disclosure: ?Patient with at least one acute or chronic illness or injury that poses a threat to life or bodily function and is being managed actively during this encounter.  All of the  below services have been performed independently by signing provider:  review of prior documentation from internal and or external health records.  Review of previous and current lab results.  Interview and comprehensive assessment during patient visit today. Review of current and previous chest radiographs/CT scans. Discussion of management and test interpretation with health care team and patient/family.  ? ?This document was prepared using Dragon voice recognition software and may include unintentional dictation errors. ? ? ?  ?Ottie Glazier, M.D.  ?Division of Pulmonary & Critical Care Medicine  ? ? ? ? ? ? ? ? ?

## 2021-06-27 NOTE — Assessment & Plan Note (Signed)
Estimated body mass index is 19.57 kg/m? as calculated from the following: ?  Height as of this encounter: 5\' 6"  (1.676 m). ?  Weight as of this encounter: 55 kg.  ? ?-Dietitian consult ?

## 2021-06-27 NOTE — Progress Notes (Signed)
VAST consult received to obtain USGIV for possible vasopressor administration. Spoke with patient's nurse who stated patient has 2 working PIV's at present and might not need norepi. If additional IV access is needed, RN will place IVT consult.  ?

## 2021-06-27 NOTE — Procedures (Signed)
Endotracheal Intubation: ?Patient required placement of an artificial airway secondary to Respiratory Failure ? ?Consent: from patient   ? ?Hand washing performed prior to starting the procedure.  ? ?Medications administered for sedation prior to procedure:  ?Midazolam 4 mg IV,  Rocuronium 40 mg IV, Fentanyl 100 mcg IV.  ? ? ?A time out procedure was called and correct patient, name, & ID confirmed. Needed supplies and equipment were assembled and checked to include ETT, 10 ml syringe, Glidescope, Mac and Miller blades, suction, oxygen and bag mask valve, end tidal CO2 monitor.  ? ?Patient was positioned to align the mouth and pharynx to facilitate visualization of the glottis.  ? ?Heart rate, SpO2 and blood pressure was continuously monitored during the procedure. Pre-oxygenation was conducted prior to intubation and endotracheal tube was placed through the vocal cords into the trachea.  ? ? ? The artificial airway was placed under direct visualization via glidescope route using a 8.0 ETT on the first attempt. ? ?ETT was secured at 24 cm mark. ? ?Placement was confirmed by auscuitation of lungs with good breath sounds bilaterally and no stomach sounds.  Condensation was noted on endotracheal tube.   ?Pulse ox 98%.  CO2 detector in place with appropriate color change.  ? ?Complications: None .  ? ? ?Chest radiograph ordered and pending.  ? ?Comments: OGT placed via glidescope. ? ? ?Ottie Glazier, M.D.  ?Pulmonary & Critical Care Medicine  ?[place]  ? ? ? ? ?

## 2021-06-28 ENCOUNTER — Inpatient Hospital Stay: Payer: No Typology Code available for payment source

## 2021-06-28 DIAGNOSIS — B59 Pneumocystosis: Secondary | ICD-10-CM

## 2021-06-28 DIAGNOSIS — B20 Human immunodeficiency virus [HIV] disease: Secondary | ICD-10-CM | POA: Diagnosis not present

## 2021-06-28 DIAGNOSIS — R0602 Shortness of breath: Secondary | ICD-10-CM | POA: Diagnosis not present

## 2021-06-28 LAB — HEPATITIS B SURFACE ANTIBODY, QUANTITATIVE: Hep B S AB Quant (Post): <3.1 m[IU]/mL — ABNORMAL LOW (ref >9.9)

## 2021-06-28 LAB — BASIC METABOLIC PANEL
Anion gap: 8 (ref 5–15)
BUN: 10 mg/dL (ref 6–20)
CO2: 22 mmol/L (ref 22–32)
Calcium: 8.1 mg/dL — ABNORMAL LOW (ref 8.9–10.3)
Chloride: 105 mmol/L (ref 98–111)
Creatinine, Ser: 1.19 mg/dL (ref 0.61–1.24)
GFR, Estimated: >60 mL/min (ref >60)
Glucose, Bld: 99 mg/dL (ref 70–99)
Potassium: 4.5 mmol/L (ref 3.5–5.1)
Sodium: 135 mmol/L (ref 135–145)

## 2021-06-28 LAB — CMV DNA BY PCR, QUALITATIVE: CMV DNA, Qual PCR: POSITIVE — AB

## 2021-06-28 LAB — TOXOPLASMA ANTIBODIES- IGG AND  IGM
Toxoplasma Antibody- IgM: <3 AU/mL (ref 0.0–7.9)
Toxoplasma IgG Ratio: <3 IU/mL (ref 0.0–7.1)

## 2021-06-28 MED ORDER — ENSURE ENLIVE PO LIQD
237.0000 mL | Freq: Three times a day (TID) | ORAL | Status: DC
  Administered 2021-06-28 – 2021-06-29 (×2): 237 mL via ORAL

## 2021-06-28 MED ORDER — ADULT MULTIVITAMIN W/MINERALS CH
1.0000 | ORAL_TABLET | Freq: Every day | ORAL | Status: DC
  Administered 2021-06-29 – 2021-07-03 (×5): 1 via ORAL
  Filled 2021-06-28 (×5): qty 1

## 2021-06-28 MED ORDER — SODIUM CHLORIDE 0.9 % IV BOLUS
1000.0000 mL | Freq: Once | INTRAVENOUS | Status: AC
  Administered 2021-06-28: 1000 mL via INTRAVENOUS

## 2021-06-28 NOTE — Progress Notes (Signed)
Initial Nutrition Assessment  DOCUMENTATION CODES:   Severe malnutrition in context of chronic illness  INTERVENTION:   Ensure Enlive po TID, each supplement provides 350 kcal and 20 grams of protein.  Double protein with meal trays   MVI po daily   Pt at high refeed risk; recommend monitor potassium, magnesium and phosphorus labs daily until stable  NUTRITION DIAGNOSIS:   Severe Malnutrition related to catabolic illness (HIV/AIDS) as evidenced by 17 percent weight loss in 1 month, severe muscle depletion, moderate fat depletion, severe fat depletion.  GOAL:   Patient will meet greater than or equal to 90% of their needs  MONITOR:   PO intake, Supplement acceptance, Labs, Weight trends, Skin, I & O's  REASON FOR ASSESSMENT:   Consult Assessment of nutrition requirement/status  ASSESSMENT:   31 y/o male with h/o newly diagnosed HIV/AIDS who is admitted with PNA.  Met with pt in room today. Pt reports decreased appetite and oral intake for the past month along with a 20lb weight loss. Per chart, pt is down 24bs(17%) over the past 1-2 months; this is severe weight loss. Pt reports that he has not yet started drinking any supplements. Pt reports that he tries to follow a plant based diet at home and reports that he eats little meat. Pt reports eating 100% of his breakfast this morning but reports that he slept through lunch. RD discussed with pt the importance of adequate nutrition needed to preserve lean muscle. Pt reports that he is willing to drink vanilla Ensure in hospital. RD will add supplements and MVI to help pt meet his estimated needs. Pt is likely at high refeed risk.   Medications reviewed and include: lovenox, protonix, bactrim   Labs reviewed: K 4.9 wnl CD4- 28(L)  NUTRITION - FOCUSED PHYSICAL EXAM:  Flowsheet Row Most Recent Value  Orbital Region Mild depletion  Upper Arm Region Severe depletion  Thoracic and Lumbar Region Moderate depletion  Buccal  Region Moderate depletion  Temple Region Moderate depletion  Clavicle Bone Region Severe depletion  Clavicle and Acromion Bone Region Severe depletion  Scapular Bone Region Moderate depletion  Dorsal Hand Moderate depletion  Patellar Region Severe depletion  Anterior Thigh Region Severe depletion  Posterior Calf Region Severe depletion  Edema (RD Assessment) None  Hair Reviewed  Eyes Reviewed  Mouth Reviewed  Skin Reviewed  Nails Reviewed   Diet Order:   Diet Order             Diet regular Room service appropriate? Yes; Fluid consistency: Thin  Diet effective now                  EDUCATION NEEDS:   Education needs have been addressed  Skin:  Skin Assessment: Reviewed RN Assessment  Last BM:  5/16  Height:   Ht Readings from Last 1 Encounters:  06/26/21 5' 6"  (1.676 m)    Weight:   Wt Readings from Last 1 Encounters:  06/28/21 53.6 kg    Ideal Body Weight:  64.5 kg  BMI:  Body mass index is 19.07 kg/m.  Estimated Nutritional Needs:   Kcal:  1900-2200kcal/day  Protein:  95-110g/day  Fluid:  1.7-1.9L/day  Koleen Distance MS, RD, LDN Please refer to [facility] for RD and/or RD on-call/weekend/after hours pager

## 2021-06-28 NOTE — Assessment & Plan Note (Signed)
Concern of atypical pneumonia possibly PGP pneumonia, with recent diagnosis of ARDS.  No hypoxia.  COVID-19 and influenza PCR negative.  Chest x-ray with bilateral groundglass opacities.  Respiratory viral panel negative.  Blood cultures remain negative.  Repeat blood cultures ordered today. S/p bronchoscopy and preliminary BAL cultures are negative. -Continue with IV Bactrim -Follow-up BAL labs -Follow-up repeat blood cultures

## 2021-06-28 NOTE — Progress Notes (Signed)
   06/28/21 0819  Assess: MEWS Score  Temp (!) 101.3 F (38.5 C)  BP 94/69  Pulse Rate (!) 107  Resp 20  SpO2 96 %  O2 Device Room Air  Assess: MEWS Score  MEWS Temp 1  MEWS Systolic 1  MEWS Pulse 1  MEWS RR 0  MEWS LOC 0  MEWS Score 3  MEWS Score Color Yellow  Assess: if the MEWS score is Yellow or Red  Were vital signs taken at a resting state? Yes  Focused Assessment No change from prior assessment  Does the patient meet 2 or more of the SIRS criteria? Yes  Does the patient have a confirmed or suspected source of infection? Yes  Provider and Rapid Response Notified? Yes  MEWS guidelines implemented *See Row Information* Yes  Treat  MEWS Interventions Administered prn meds/treatments  Pain Scale 0-10  Pain Score 0  Take Vital Signs  Increase Vital Sign Frequency  Yellow: Q 2hr X 2 then Q 4hr X 2, if remains yellow, continue Q 4hrs  Escalate  MEWS: Escalate Yellow: discuss with charge nurse/RN and consider discussing with provider and RRT  Notify: Charge Nurse/RN  Name of Charge Nurse/RN Notified [place], RN  Date Charge Nurse/RN Notified 06/28/21  Time Charge Nurse/RN Notified [id number]  Notify: Provider  Provider Name/Title Nelson Chimes  Date Provider Notified 06/28/21  Time Provider Notified [phone]  Method of Notification Face-to-face  Notification Reason Change in status  Provider response En route  Date of Provider Response 06/28/21  Time of Provider Response [phone]  Document  Patient Outcome Stabilized after interventions  Progress note created (see row info) Yes  Assess: SIRS CRITERIA  SIRS Temperature  1  SIRS Pulse 1  SIRS Respirations  0  SIRS WBC 0  SIRS Score Sum  2

## 2021-06-28 NOTE — Plan of Care (Signed)
°  Problem: Activity: °Goal: Ability to tolerate increased activity will improve °Outcome: Progressing °  °Problem: Respiratory: °Goal: Ability to maintain adequate ventilation will improve °Outcome: Progressing °Goal: Ability to maintain a clear airway will improve °Outcome: Progressing °  °

## 2021-06-28 NOTE — Progress Notes (Addendum)
Progress Note   Patient: Kent Page CBJ:[id number] DOB: 1990/06/19 DOA: 06/26/2021     2 DOS: the patient was seen and examined on 06/28/2021   Brief hospital course: Patient is a 31 years old male with past medical history of recently diagnosed HIV was sent from ID clinic with concerns for increasing shortness of breath with hypoxia noted in the clinic with pulse ox in the 80s is in room air.  Patient was newly diagnosed of HIV/AIDs recently with a CD4 count of 29.  He was in the process of setting up treatment plan with ID   There was some concern for PJP pneumonia and the patient was sent to the hospital.  Patient states that he has been having dyspnea and shortness of breath since April which has been gradually worsening at this time.  He does have dyspnea on exertion and has mostly cough which is dry in nature with some productive cough in the morning.  Denies any fever,chills or rigor.  Denies any chest pain.  No hemoptysis.  Denies any sick contacts.  Does have a diminished appetite and has lost around 20 pounds of weight loss since April.  Denies any nausea vomiting or diarrhea.  Denies any urinary urgency frequency or dysuria.  He states that he has been smoking marijuana but denies smoking cigarettes recently.  Used to drink alcohol but has quit since March 2023.  In the ED, patient was mildly hypotensive with blood pressure of 96/77.  Pulse oximetry was however 95% on room air .  Repeated test showed WBC at 9.4 with hemoglobin of 12.2.  COVID and influenza was negative.  Lactic acid was elevated at 2.4 sodium of 135 with potassium of 3.9.  Creatinine was 1.1.  LFTs were within normal limits.  Patient received 1 L of IV fluid bolus in the ED.  Pulmonary was consulted from the ED for bronchoscopy.  Patient was then considered for admission to hospital for further evaluation and treatment.  5/17: Patient was waiting for bronchoscopy when seen today.  He was very concerned about not starting  HIV treatment early.  5/18: Patient had successful bronchoscopy and BAL was done, preliminary cultures are negative so far.  Patient developed fever at 101.3 this morning requiring Tylenol. Repeat blood cultures were done. Repeat chest x-ray with little worsening of right-sided infiltrates Per ID we will continue with Bactrim at this time and follow the cultures.   Assessment and Plan: * Pneumonia Concern of atypical pneumonia possibly PGP pneumonia, with recent diagnosis of ARDS.  No hypoxia.  COVID-19 and influenza PCR negative.  Chest x-ray with bilateral groundglass opacities.  Respiratory viral panel negative.  Blood cultures remain negative.  Repeat blood cultures ordered today. S/p bronchoscopy and preliminary BAL cultures are negative. -Continue with IV Bactrim -Follow-up BAL labs -Follow-up repeat blood cultures  AIDS ([place]) New diagnosis of AIDS.Absolute CD4 count of 28 with significantly elevated viral load at 421 K. from blood work 06/19/2021.  ID Dr. Delaine Lame on board.  Chlamydia and gonorrhea are negative.  RPR negative, cryptococcal antigen negative.  Hepatitis panel negative. -ID is planning to start HAART from next week  SOB (shortness of breath) Most likely secondary to pneumonia.  No hypoxia. -Continue to monitor -Treat as above  Lactate blood increased Resolved.  Protein-calorie malnutrition, severe ([place]) Estimated body mass index is 19.57 kg/m as calculated from the following:   Height as of this encounter: _0  (1.676 m).   Weight as of this encounter: 55 kg.   -  Dietitian consult     Subjective: Patient was feeling much improved after taking the Tylenol.  He developed fever earlier in the morning.  Denies any other complaints.  Physical Exam: Vitals:   06/28/21 0819 06/28/21 1004 06/28/21 1147 06/28/21 1613  BP: 94/69 (!) 95/57 100/62 95/73  Pulse: (!) 107 (!) 104 87 81  Resp: _0 Temp: (!) 101.3 F (38.5 C) 98.2 F (36.8 C) 98.3 F (36.8  C) 99 F (37.2 C)  TempSrc:   Oral   SpO2: 96% 100% 100% 96%  Weight:      Height:       General.  Malnourished gentleman, in no acute distress. Pulmonary.  Lungs clear bilaterally, normal respiratory effort. CV.  Regular rate and rhythm, no JVD, rub or murmur. Abdomen.  Soft, nontender, nondistended, BS positive. CNS.  Alert and oriented .  No focal neurologic deficit. Extremities.  No edema, no cyanosis, pulses intact and symmetrical. Psychiatry.  Judgment and insight appears normal.  Data Reviewed: Prior notes, labs and images reviewed  Family Communication:   Disposition: Status is: Inpatient Remains inpatient appropriate because: Severity of illness   Planned Discharge Destination: Home  DVT prophylaxis.  Lovenox Time spent: 45 minutes  This record has been created using Systems analyst. Errors have been sought and corrected,but may not always be located. Such creation errors do not reflect on the standard of care.  Author: Lorella Nimrod, MD 06/28/2021 4:58 PM  For on call review [email or web address].

## 2021-06-28 NOTE — Progress Notes (Signed)
PULMONOLOGY         Date: 06/28/2021,   MRN# [id number] Kent Page 10/30/1990     AdmissionWeight: 54.4 kg                 CurrentWeight: 53.6 kg  Referring provider: Dr. 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  Recent CD4 count of 29.  Was setting up plan with infectious disease for AIDS therapy.  Had concerns for PJP pneumonia and was sent to the hospital to evaluate for this.  Reports ongoing progressive shortness of breath from April denies sick contacts does admit to productive cough denies fevers chills hemoptysis has been anorexic with over 20 pound weight loss in the last few months.  Generally smokes marijuana and cigarettes daily does use alcohol but declines if he is with alcoholism has not had seizures in the past.  In the ED was noted to be hypotensive tachycardic with hypoxemia on mild exertion mild AKI with creatinine 1.1 otherwise electrolytes are fairly preserved received resuscitative fluids pulmonary consultation for bronchoscopic airway inspection and microbiology with bronchoalveolar lavage.  06/27/21- met with patient explained planned procedure , he consents to endotracheal intubation and bronchoscopy with BAL and planned airway inspection with therapeutic aspiration of tracheobronchial tree. Patient is agreeable.  Reviewed risks/complications and benefits with patient, risks include infection, pneumothorax/pneumomediastinum which may require chest tube placement as well as overnight/prolonged hospitalization and possible mechanical ventilation. Other risks include bleeding and very rarely death.  Patient understands risks and wishes to proceed.  Additional questions were answered, and patient is aware that post procedure patient will be going home  with family and may experience cough with possible clots on expectoration as well as phlegm which may last few days as well as hoarseness of voice post intubation and mechanical ventilation.  06/28/2021-patient seen and evaluated at bedside.  Remains on room air with no shortness of breath and normoxia.  He did have a fever overnight however this has resolved and he feels asymptomatic.  Microbiology from bronchoalveolar lavage is still in process.  Patient is stable and may possibly have outpatient therapy if he continues to improve.  PAST MEDICAL HISTORY   Past Medical History:  Diagnosis Date   HIV (human immunodeficiency virus infection) ([place])      SURGICAL HISTORY   History reviewed. No pertinent surgical history.   FAMILY HISTORY   History reviewed. No pertinent family history.   SOCIAL HISTORY   Social History   Tobacco Use   Smoking status: Former    Types: Cigarettes    Passive exposure: Past   Smokeless tobacco: Never  Vaping Use   Vaping Use: Never used  Substance Use Topics   Alcohol use: Yes    Comment: daily   Drug use: Not Currently     MEDICATIONS    Home Medication:     Current Medication:  Current Facility-Administered Medications:    0.9 %  sodium chloride infusion, 250 mL, Intravenous, PRN, Pokhrel, Laxman, MD, Stopped at 06/27/21 1800   0.9 %  sodium chloride infusion, 250 mL, Intravenous, Continuous, Ottie Glazier, MD, Held at 06/27/21 1255   acetaminophen (TYLENOL) tablet 650 mg, 650 mg, Oral, Q6H PRN, 650 mg at 06/28/21 0844 **OR** acetaminophen (TYLENOL) suppository 650 mg, 650 mg, Rectal, Q6H PRN, Flora Lipps, MD  albuterol (PROVENTIL) (2.5 MG/3ML) 0.083% nebulizer solution 2.5 mg, 2.5 mg, Nebulization, Q4H PRN, Pokhrel, Laxman, MD   Chlorhexidine Gluconate Cloth 2 % PADS 6 each, 6 each, Topical, Daily, Lorella Nimrod, MD, 6 each at 06/28/21 1028   enoxaparin (LOVENOX) injection 40 mg, 40 mg, Subcutaneous, Q24H, Pokhrel, Laxman, MD,  40 mg at 06/27/21 2037   fentaNYL 2533mg in NS 2542m(1069mml) infusion-PREMIX, 0-200 mcg/hr, Intravenous, Continuous, AleLanney Ginsuad, MD, Stopped at 06/27/21 1254   ondansetron (ZOFRAN) tablet 4 mg, 4 mg, Oral, Q6H PRN **OR** ondansetron (ZOFRAN) injection 4 mg, 4 mg, Intravenous, Q6H PRN, Pokhrel, Laxman, MD   pantoprazole (PROTONIX) EC tablet 40 mg, 40 mg, Oral, Daily, Pokhrel, Laxman, MD, 40 mg at 06/28/21 1027   senna-docusate (Senokot-S) tablet 1 tablet, 1 tablet, Oral, QHS PRN, Pokhrel, Laxman, MD   sodium chloride flush (NS) 0.9 % injection 3 mL, 3 mL, Intravenous, Q12H, Pokhrel, Laxman, MD, 3 mL at 06/28/21 1028   sodium chloride flush (NS) 0.9 % injection 3 mL, 3 mL, Intravenous, PRN, Pokhrel, Laxman, MD   sulfamethoxazole-trimethoprim (BACTRIM) 320 mg in dextrose 5 % 500 mL IVPB, 320 mg, Intravenous, Q8H, Zeigler, Dustin G, RPH, Last Rate: 346.7 mL/hr at 06/28/21 0505, 320 mg at 06/28/21 0505    ALLERGIES   Patient has no known allergies.     REVIEW OF SYSTEMS    Review of Systems:  Gen:  Denies  fever, sweats, chills weigh loss  HEENT: Denies blurred vision, double vision, ear pain, eye pain, hearing loss, nose bleeds, sore throat Cardiac:  No dizziness, chest pain or heaviness, chest tightness,edema Resp:   reports dyspnea chronically  Gi: Denies swallowing difficulty, stomach pain, nausea or vomiting, diarrhea, constipation, bowel incontinence Gu:  Denies bladder incontinence, burning urine Ext:   Denies Joint pain, stiffness or swelling Skin: Denies  skin rash, easy bruising or bleeding or hives Endoc:  Denies polyuria, polydipsia , polyphagia or weight change Psych:   Denies depression, insomnia or hallucinations   Other:  All other systems negative   VS: BP 100/62 (BP Location: Right Arm)   Pulse 87   Temp 98.3 F (36.8 C) (Oral)   Resp 20   Ht _0  (1.676 m)   Wt 53.6 kg   SpO2 100%   BMI 19.07 kg/m      PHYSICAL EXAM    GENERAL:NAD, no  fevers, chills, no weakness no fatigue HEAD: Normocephalic, atraumatic.  EYES: Pupils equal, round, reactive to light. Extraocular muscles intact. No scleral icterus.  MOUTH: Moist mucosal membrane. Dentition intact. No abscess noted.  EAR, NOSE, THROAT: Clear without exudates. No external lesions.  NECK: Supple. No thyromegaly. No nodules. No JVD.  PULMONARY: decreased breath sounds with mild rhonchi worse at bases bilaterally.  CARDIOVASCULAR: S1 and S2. Regular rate and rhythm. No murmurs, rubs, or gallops. No edema. Pedal pulses 2+ bilaterally.  GASTROINTESTINAL: Soft, nontender, nondistended. No masses. Positive bowel sounds. No hepatosplenomegaly.  MUSCULOSKELETAL: No swelling, clubbing, or edema. Range of motion full in all extremities.  NEUROLOGIC: Cranial nerves II through XII are intact. No gross focal neurological deficits. Sensation intact. Reflexes intact.  SKIN: No ulceration, lesions, rashes, or cyanosis. Skin warm and dry. Turgor intact.  PSYCHIATRIC: Mood, affect within normal limits. The patient is awake, alert and oriented x 3. Insight, judgment intact.       IMAGING   _1 @   ASSESSMENT/PLAN   Acute hypoxemic respiratory failure-resolved Patient with active AIDS and is under the care of infectious disease team  in process of receiving therapy -Status post ID evaluation-appreciate input -There is recommendation for bronchoscopic evaluation with microbiology to be sent for KS/PJP/CMV -Patient reports some improvement since hospitalization and having resuscitative fluids #Patient is currently normoxic on room air and reports being close to baseline  Centrilobular emphysema -Active tobacco and THC abuse.  Smoking     AIDS CD4 less than 50 -Infectious disease on case-appreciate input    Thank you for allowing me to participate in the care of this patient.   Patient/Family are satisfied with care plan and all questions have been answered.    Provider  disclosure: Patient with at least one acute or chronic illness or injury that poses a threat to life or bodily function and is being managed actively during this encounter.  All of the below services have been performed independently by signing provider:  review of prior documentation from internal and or external health records.  Review of previous and current lab results.  Interview and comprehensive assessment during patient visit today. Review of current and previous chest radiographs/CT scans. Discussion of management and test interpretation with health care team and patient/family.   This document was prepared using Dragon voice recognition software and may include unintentional dictation errors.     Ottie Glazier, M.D.  Division of Pulmonary & Critical Care Medicine

## 2021-06-28 NOTE — Assessment & Plan Note (Signed)
Most likely secondary to pneumonia.  No hypoxia. -Continue to monitor -Treat as above

## 2021-06-28 NOTE — Progress Notes (Signed)
Date of Admission:  06/26/2021      ID: Kent Page is a 31 y.o. male Principal Problem:   Pneumonia Active Problems:   AIDS ([place])   SOB (shortness of breath)   Lactate blood increased   Protein-calorie malnutrition, severe ([facility])    Subjective: Patient feeling tired Had a temperature earlier this morning but he did not feel it Medications:   Chlorhexidine Gluconate Cloth  6 each Topical Daily   enoxaparin (LOVENOX) injection  40 mg Subcutaneous Q24H   feeding supplement  237 mL Oral TID BM   [START ON 06/29/2021] multivitamin with minerals  1 tablet Oral Daily   pantoprazole  40 mg Oral Daily   sodium chloride flush  3 mL Intravenous Q12H    Objective: Vital signs in last 24 hours: Temp:  [97.9 F (36.6 C)-101.3 F (38.5 C)] 98.3 F (36.8 C) (05/18 1147) Pulse Rate:  [80-107] 87 (05/18 1147) Resp:  [18-27] 20 (05/18 1004) BP: (88-109)/(56-79) 100/62 (05/18 1147) SpO2:  [94 %-100 %] 100 % (05/18 1147) FiO2 (%):  [21 %-36 %] 21 % (05/18 1147) Weight:  [53.6 kg] 53.6 kg (05/18 0500)   PHYSICAL EXAM:  General: Alert, cooperative, no distress, appears stated age.  Head: Normocephalic, without obvious abnormality, atraumatic. Eyes: Conjunctivae clear, anicteric sclerae. Pupils are equal ENT Nares normal. No drainage or sinus tenderness. Lips, mucosa, and tongue normal. No Thrush Neck: Supple, symmetrical, no adenopathy, thyroid: non tender no carotid bruit and no JVD. Back: No CVA tenderness. Lungs: Lateral air entry Few  crepitations in the bases Heart: Tachycardia Abdomen: Soft, non-tender,not distended. Bowel sounds normal. No masses Extremities: atraumatic, no cyanosis. No edema. No clubbing Skin: No rashes or lesions. Or bruising Lymph: Cervical, supraclavicular normal. Neurologic: Grossly non-focal  Lab Results Recent Labs    06/26/21 1151 06/27/21 0429 06/27/21 0613 06/28/21 0451  WBC 9.4  --  6.4  --   HGB 12.2*  --  11.1*  --   HCT 36.8*  --   33.4*  --   NA 135 137  --  135  K 3.9 4.9  --  4.5  CL 99 107  --  105  CO2 24 22  --  22  BUN 13 12  --  10  CREATININE 1.11 1.04  --  1.19   Liver Panel Recent Labs    06/26/21 1151 06/27/21 0429  PROT 7.4 7.0  ALBUMIN 2.4* 2.1*  AST 35 29  ALT 29 29  ALKPHOS 50 44  BILITOT 0.5 0.2*  Microbiology: BAL cell count 17 ( 53% M, 145 L BC NG Studies/Results: DG Chest Port 1 View  Result Date: 06/28/2021 CLINICAL DATA:  Shortness of, history of HIV EXAM: PORTABLE CHEST 1 VIEW COMPARISON:  Chest radiograph 06/26/2021 FINDINGS: The cardiomediastinal silhouette is stable. There is patchy airspace disease most prominent in the right lower lobe, slightly worsened since 06/26/2021. There is no pleural effusion. There is no pneumothorax. There is no acute osseous abnormality. IMPRESSION: Patchy airspace disease in the right lung has slightly worsened since 06/26/2021. Electronically Signed   By: Valetta Mole M.D.   On: 06/28/2021 09:27     Assessment/Plan: 31 yr male presenting with sob, fatigue and weight loss of more than 3 weeks Diagnosed HIV on 06/19/21, Cd4 is 28 and Vl 421K Pt has advanced aids with progressive dyspnea  with b/l GGO in CXR which is progressing compared to a week- no improvement with levaquin which he has been taking from last  week Hypoxia with desaturation on exercise ABG shows hypoxia  D.D - PJP  ( previously called PCP) KS Or other Opportunistic infections like CMV pneumonitis ( no fever)  bronchoscopy done and BAL sent for different tests  fungitell ( beta D glucan)-result pending CMV PCR-Pending Toxo, crypto, hepatitis profile-neg RESp panel PCR-neg   On Iv bactrim and PO prednisone Can be changed to PO soon HAART can be started in 24-48 hrs   Weight loss due to AIDS   Hypoalbuminemia due to AIDS   Discussed the management with the patient

## 2021-06-29 DIAGNOSIS — B59 Pneumocystosis: Secondary | ICD-10-CM | POA: Diagnosis not present

## 2021-06-29 DIAGNOSIS — R0602 Shortness of breath: Secondary | ICD-10-CM | POA: Diagnosis not present

## 2021-06-29 DIAGNOSIS — B20 Human immunodeficiency virus [HIV] disease: Secondary | ICD-10-CM | POA: Diagnosis not present

## 2021-06-29 LAB — PHOSPHORUS: Phosphorus: 3.1 mg/dL (ref 2.5–4.6)

## 2021-06-29 LAB — BASIC METABOLIC PANEL
Anion gap: 7 (ref 5–15)
BUN: 8 mg/dL (ref 6–20)
CO2: 21 mmol/L — ABNORMAL LOW (ref 22–32)
Calcium: 8.1 mg/dL — ABNORMAL LOW (ref 8.9–10.3)
Chloride: 106 mmol/L (ref 98–111)
Creatinine, Ser: 1.22 mg/dL (ref 0.61–1.24)
GFR, Estimated: >60 mL/min (ref >60)
Glucose, Bld: 102 mg/dL — ABNORMAL HIGH (ref 70–99)
Potassium: 4.6 mmol/L (ref 3.5–5.1)
Sodium: 134 mmol/L — ABNORMAL LOW (ref 135–145)

## 2021-06-29 LAB — MAGNESIUM: Magnesium: 2 mg/dL (ref 1.7–2.4)

## 2021-06-29 LAB — CMV DNA, QUANTITATIVE, PCR
CMV DNA Quant: 498 IU/mL
Log10 CMV Qn DNA Pl: 2.697 log10 IU/mL

## 2021-06-29 MED ORDER — PREDNISONE 20 MG PO TABS
40.0000 mg | ORAL_TABLET | Freq: Two times a day (BID) | ORAL | Status: AC
  Administered 2021-06-30 – 2021-07-02 (×6): 40 mg via ORAL
  Filled 2021-06-29 (×6): qty 2

## 2021-06-29 MED ORDER — BICTEGRAVIR-EMTRICITAB-TENOFOV 50-200-25 MG PO TABS
1.0000 | ORAL_TABLET | Freq: Every day | ORAL | Status: DC
  Filled 2021-06-29: qty 1

## 2021-06-29 MED ORDER — SODIUM CHLORIDE 0.9 % IV BOLUS
1000.0000 mL | Freq: Once | INTRAVENOUS | Status: AC
  Administered 2021-06-29: 1000 mL via INTRAVENOUS

## 2021-06-29 MED ORDER — PREDNISONE 20 MG PO TABS: 40.0000 mg | ORAL_TABLET | Freq: Every day | ORAL | Status: DC

## 2021-06-29 MED ORDER — PREDNISONE 20 MG PO TABS
40.0000 mg | ORAL_TABLET | Freq: Every day | ORAL | Status: DC
Start: 2021-07-03 — End: 2021-07-04
  Administered 2021-07-03 – 2021-07-04 (×2): 40 mg via ORAL
  Filled 2021-06-29 (×2): qty 2

## 2021-06-29 MED ORDER — BICTEGRAVIR-EMTRICITAB-TENOFOV 50-200-25 MG PO TABS
1.0000 | ORAL_TABLET | Freq: Every day | ORAL | Status: DC
  Administered 2021-07-01 – 2021-07-04 (×4): 1 via ORAL
  Filled 2021-06-29 (×4): qty 1

## 2021-06-29 NOTE — Assessment & Plan Note (Signed)
Concern of atypical pneumonia possibly PGP pneumonia, with recent diagnosis of ARDS.  No hypoxia.  COVID-19 and influenza PCR negative.  Chest x-ray with bilateral groundglass opacities.  Respiratory viral panel negative.  Blood cultures remain negative.  Repeat blood cultures done yesterday are negative in 24 hours S/p bronchoscopy and preliminary BAL cultures are negative. -Continue with IV Bactrim -Follow-up BAL labs -Follow-up repeat blood cultures

## 2021-06-29 NOTE — Progress Notes (Signed)
PULMONOLOGY         Date: 06/29/2021,   MRN# [id number] Kent Page 03/11/1990     AdmissionWeight: 54.4 kg                 CurrentWeight: 53.8 kg  Referring provider: Dr. 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  Recent CD4 count of 29.  Was setting up plan with infectious disease for AIDS therapy.  Had concerns for PJP pneumonia and was sent to the hospital to evaluate for this.  Reports ongoing progressive shortness of breath from April denies sick contacts does admit to productive cough denies fevers chills hemoptysis has been anorexic with over 20 pound weight loss in the last few months.  Generally smokes marijuana and cigarettes daily does use alcohol but declines if he is with alcoholism has not had seizures in the past.  In the ED was noted to be hypotensive tachycardic with hypoxemia on mild exertion mild AKI with creatinine 1.1 otherwise electrolytes are fairly preserved received resuscitative fluids pulmonary consultation for bronchoscopic airway inspection and microbiology with bronchoalveolar lavage.  06/27/21- met with patient explained planned procedure , he consents to endotracheal intubation and bronchoscopy with BAL and planned airway inspection with therapeutic aspiration of tracheobronchial tree. Patient is agreeable.  Reviewed risks/complications and benefits with patient, risks include infection, pneumothorax/pneumomediastinum which may require chest tube placement as well as overnight/prolonged hospitalization and possible mechanical ventilation. Other risks include bleeding and very rarely death.  Patient understands risks and wishes to proceed.  Additional questions were answered, and patient is aware that post procedure patient will be going home  with family and may experience cough with possible clots on expectoration as well as phlegm which may last few days as well as hoarseness of voice post intubation and mechanical ventilation.  06/28/2021-patient seen and evaluated at bedside.  Remains on room air with no shortness of breath and normoxia.  He did have a fever overnight however this has resolved and he feels asymptomatic.  Microbiology from bronchoalveolar lavage is still in process.  Patient is stable and may possibly have outpatient therapy if he continues to improve.  06/29/21- patient is stable , he is nonfebrile. He had normal well formed BM.  He is urinating well and he is eating well. He is cleared for dc home from pulmonary perspective.  Thus far nothing has grown from BAL microbiology.   PAST MEDICAL HISTORY   Past Medical History:  Diagnosis Date   HIV (human immunodeficiency virus infection) ([place])      SURGICAL HISTORY   History reviewed. No pertinent surgical history.   FAMILY HISTORY   History reviewed. No pertinent family history.   SOCIAL HISTORY   Social History   Tobacco Use   Smoking status: Former    Types: Cigarettes    Passive exposure: Past   Smokeless tobacco: Never  Vaping Use   Vaping Use: Never used  Substance Use Topics   Alcohol use: Yes    Comment: daily   Drug use: Not Currently     MEDICATIONS    Home Medication:     Current Medication:  Current Facility-Administered Medications:    0.9 %  sodium chloride infusion, 250 mL, Intravenous, PRN, Pokhrel, Laxman, MD, Stopped at 06/27/21 1800   0.9 %  sodium  chloride infusion, 250 mL, Intravenous, Continuous, Ottie Glazier, MD, Stopped at 06/29/21 0516   acetaminophen (TYLENOL) tablet 650 mg, 650 mg, Oral, Q6H PRN, 650 mg at 06/29/21 0429 **OR** acetaminophen (TYLENOL) suppository 650 mg, 650 mg, Rectal, Q6H PRN, Pokhrel, Laxman, MD   albuterol (PROVENTIL) (2.5 MG/3ML) 0.083% nebulizer solution 2.5 mg, 2.5 mg, Nebulization,  Q4H PRN, Pokhrel, Laxman, MD   Chlorhexidine Gluconate Cloth 2 % PADS 6 each, 6 each, Topical, Daily, Lorella Nimrod, MD, 6 each at 06/28/21 1028   enoxaparin (LOVENOX) injection 40 mg, 40 mg, Subcutaneous, Q24H, Pokhrel, Laxman, MD, 40 mg at 06/28/21 2029   feeding supplement (ENSURE ENLIVE / ENSURE PLUS) liquid 237 mL, 237 mL, Oral, TID BM, Lorella Nimrod, MD, 237 mL at 06/28/21 1544   fentaNYL 2515mg in NS 2525m(1020mml) infusion-PREMIX, 0-200 mcg/hr, Intravenous, Continuous, AleOttie GlazierD, Stopped at 06/27/21 1254   multivitamin with minerals tablet 1 tablet, 1 tablet, Oral, Daily, AmiLorella NimrodD   ondansetron (ZOFRAN) tablet 4 mg, 4 mg, Oral, Q6H PRN **OR** ondansetron (ZOFRAN) injection 4 mg, 4 mg, Intravenous, Q6H PRN, Pokhrel, Laxman, MD   pantoprazole (PROTONIX) EC tablet 40 mg, 40 mg, Oral, Daily, Pokhrel, Laxman, MD, 40 mg at 06/28/21 1027   senna-docusate (Senokot-S) tablet 1 tablet, 1 tablet, Oral, QHS PRN, Pokhrel, Laxman, MD   sodium chloride flush (NS) 0.9 % injection 3 mL, 3 mL, Intravenous, Q12H, Pokhrel, Laxman, MD, 3 mL at 06/28/21 2023   sodium chloride flush (NS) 0.9 % injection 3 mL, 3 mL, Intravenous, PRN, Pokhrel, Laxman, MD   sulfamethoxazole-trimethoprim (BACTRIM) 320 mg in dextrose 5 % 500 mL IVPB, 320 mg, Intravenous, Q8H, Zeigler, Dustin G, RPH, Last Rate: 346.7 mL/hr at 06/29/21 0600, Infusion Verify at 06/29/21 0600    ALLERGIES   Patient has no known allergies.     REVIEW OF SYSTEMS    Review of Systems:  Gen:  Denies  fever, sweats, chills weigh loss  HEENT: Denies blurred vision, double vision, ear pain, eye pain, hearing loss, nose bleeds, sore throat Cardiac:  No dizziness, chest pain or heaviness, chest tightness,edema Resp:   reports dyspnea chronically  Gi: Denies swallowing difficulty, stomach pain, nausea or vomiting, diarrhea, constipation, bowel incontinence Gu:  Denies bladder incontinence, burning urine Ext:   Denies Joint pain,  stiffness or swelling Skin: Denies  skin rash, easy bruising or bleeding or hives Endoc:  Denies polyuria, polydipsia , polyphagia or weight change Psych:   Denies depression, insomnia or hallucinations   Other:  All other systems negative   VS: BP 103/70   Pulse 89   Temp 100.3 F (37.9 C) (Oral)   Resp 18   Ht _0  (1.676 m)   Wt 53.8 kg   SpO2 97%   BMI 19.14 kg/m      PHYSICAL EXAM    GENERAL:NAD, no fevers, chills, no weakness no fatigue HEAD: Normocephalic, atraumatic.  EYES: Pupils equal, round, reactive to light. Extraocular muscles intact. No scleral icterus.  MOUTH: Moist mucosal membrane. Dentition intact. No abscess noted.  EAR, NOSE, THROAT: Clear without exudates. No external lesions.  NECK: Supple. No thyromegaly. No nodules. No JVD.  PULMONARY: decreased breath sounds with mild rhonchi worse at bases bilaterally.  CARDIOVASCULAR: S1 and S2. Regular rate and rhythm. No murmurs, rubs, or gallops. No edema. Pedal pulses 2+ bilaterally.  GASTROINTESTINAL: Soft, nontender, nondistended. No masses. Positive bowel sounds. No hepatosplenomegaly.  MUSCULOSKELETAL: No swelling, clubbing, or edema. Range of motion full in all extremities.  NEUROLOGIC: Cranial nerves II through XII are intact. No gross focal neurological deficits. Sensation intact. Reflexes intact.  SKIN: No ulceration, lesions, rashes, or cyanosis. Skin warm and dry. Turgor intact.  PSYCHIATRIC: Mood, affect within normal limits. The patient is awake, alert and oriented x 3. Insight, judgment intact.       IMAGING   _0 @   ASSESSMENT/PLAN   Acute hypoxemic respiratory failure-resolved Patient with active AIDS and is under the care of infectious disease team in process of receiving therapy -Status post ID evaluation-appreciate input -There is recommendation for bronchoscopic evaluation with microbiology to be sent for KS/PJP/CMV -Patient reports some improvement since hospitalization and  having resuscitative fluids #Patient is currently normoxic on room air and reports being close to baseline  Centrilobular emphysema -Active tobacco and THC abuse.  Smoking     AIDS CD4 less than 50 -Infectious disease on case-appreciate input    Thank you for allowing me to participate in the care of this patient.   Patient/Family are satisfied with care plan and all questions have been answered.    Provider disclosure: Patient with at least one acute or chronic illness or injury that poses a threat to life or bodily function and is being managed actively during this encounter.  All of the below services have been performed independently by signing provider:  review of prior documentation from internal and or external health records.  Review of previous and current lab results.  Interview and comprehensive assessment during patient visit today. Review of current and previous chest radiographs/CT scans. Discussion of management and test interpretation with health care team and patient/family.   This document was prepared using Dragon voice recognition software and may include unintentional dictation errors.     Ottie Glazier, M.D.  Division of Pulmonary & Critical Care Medicine

## 2021-06-29 NOTE — Assessment & Plan Note (Signed)
Most likely secondary to pneumonia.  No hypoxia. -Continue to monitor -Treat as above 

## 2021-06-29 NOTE — Progress Notes (Signed)
Pharmacy Antibiotic Note  Kent Page is a 31 y.o. male admitted on 06/26/2021 with possible pneumocystis pneumonia.  Pharmacy has been consulted for trimethoprim/sulfamethoxazole dosing. Presenting from ID clinic with shortness of breath.  He is newly diagnosed with HIV with a CD4 = 29.    Today, 06/29/2021 Day #3 TMP/SMZ CXR with Bilateral ground glass opacities WBC WNL No fevers SCr relatively stable at 1.22 K+ stable at 4.6 LDH = 190 BAL performed 5/17 BAL cx: NGTD Multiple labs pending  Plan: TMP/SMZ 310m IV q8h (=17.6 mg/kg/day of TMP) Monitor renal function and K+  Prednisone stopped - f/u plan  Await labs: Beta-D glucan,  Pneumocystis PCR to result  Height: _0  (167.6 cm) Weight: 53.8 kg (118 lb 9.7 oz) IBW/kg (Calculated) : 63.8  Temp (24hrs), Avg:99.3 F (37.4 C), Min:98.2 F (36.8 C), Max:100.3 F (37.9 C)  Recent Labs  Lab 06/26/21 1150 06/26/21 1151 06/26/21 1726 06/27/21 0429 06/27/21 0613 06/28/21 0451  WBC  --  9.4  --   --  6.4  --   CREATININE  --  1.11  --  1.04  --  1.19  LATICACIDVEN 2.4*  --  0.8  --   --   --      Estimated Creatinine Clearance: 67.2 mL/min (by C-G formula based on SCr of 1.19 mg/dL).    No Known Allergies  Antimicrobials this admission: TMP/SMZ 5/16 >>   Microbiology results: 5/16 Bcx: NGTD 5/17 BAL: NGTD 5/18 Bcx NGTD  Thank you for allowing pharmacy to be a part of this patient's care.  DDoreene Eland PharmD, BCPS, BCIDP Work Cell: [phone]/19/2023 1:08 PM

## 2021-06-29 NOTE — Progress Notes (Signed)
Date of Admission:  06/26/2021      ID: Kent Page is a 31 y.o. male Principal Problem:   Pneumonia Active Problems:   AIDS ([place])   SOB (shortness of breath)   Lactate blood increased   Protein-calorie malnutrition, severe (HCC)    Subjective: Patient feeling tired, nauseous, no appetite No fever today No sob at rest   Medications:   [START ON 07/01/2021] bictegravir-emtricitabine-tenofovir AF  1 tablet Oral Daily   Chlorhexidine Gluconate Cloth  6 each Topical Daily   enoxaparin (LOVENOX) injection  40 mg Subcutaneous Q24H   feeding supplement  237 mL Oral TID BM   multivitamin with minerals  1 tablet Oral Daily   pantoprazole  40 mg Oral Daily   predniSONE  40 mg Oral BID WC   Followed by   Derrill Memo ON 07/04/2021] predniSONE  40 mg Oral Q breakfast   sodium chloride flush  3 mL Intravenous Q12H    Objective: Vital signs in last 24 hours: Temp:  [98.2 F (36.8 C)-100.3 F (37.9 C)] 99.6 F (37.6 C) (05/19 1549) Pulse Rate:  [87-110] 110 (05/19 1549) Resp:  [17-20] 18 (05/19 1549) BP: (83-112)/(51-76) 112/76 (05/19 1549) SpO2:  [96 %-98 %] 96 % (05/19 1549) Weight:  [53.8 kg] 53.8 kg (05/19 0500)   PHYSICAL EXAM:  General: Alert, cooperative, no distress, appears stated age. emaciated Head: Normocephalic, without obvious abnormality, atraumatic. Eyes: Conjunctivae clear, anicteric sclerae. Pupils are equal ENT Nares normal. No drainage or sinus tenderness. Tongue coated Neck: Supple, symmetrical, no adenopathy, thyroid: non tender no carotid bruit and no JVD. Back: No CVA tenderness. Lungs:b/l air entry B/l crepitations in the bases Heart: Tachycardia Abdomen: Soft, non-tender,not distended. Bowel sounds normal. No masses Extremities: atraumatic, no cyanosis. No edema. No clubbing Skin: No rashes or lesions. Or bruising Lymph: Cervical, supraclavicular normal. Neurologic: Grossly non-focal  Lab Results Recent Labs    06/27/21 0613 06/28/21 0451  06/29/21 1353  WBC 6.4  --   --   HGB 11.1*  --   --   HCT 33.4*  --   --   NA  --  135 134*  K  --  4.5 4.6  CL  --  105 106  CO2  --  22 21*  BUN  --  10 8  CREATININE  --  1.19 1.22   Liver Panel Recent Labs    06/27/21 0429  PROT 7.0  ALBUMIN 2.1*  AST 29  ALT 29  ALKPHOS 44  BILITOT 0.2*  Microbiology: BAL cell count 17 ( 53% M, 145 L BC NG Studies/Results: DG Chest Port 1 View  Result Date: 06/28/2021 CLINICAL DATA:  Shortness of, history of HIV EXAM: PORTABLE CHEST 1 VIEW COMPARISON:  Chest radiograph 06/26/2021 FINDINGS: The cardiomediastinal silhouette is stable. There is patchy airspace disease most prominent in the right lower lobe, slightly worsened since 06/26/2021. There is no pleural effusion. There is no pneumothorax. There is no acute osseous abnormality. IMPRESSION: Patchy airspace disease in the right lung has slightly worsened since 06/26/2021. Electronically Signed   By: Valetta Mole M.D.   On: 06/28/2021 09:27     Assessment/Plan: 31 yr male presenting with sob, fatigue and weight loss of more than 3 weeks Diagnosed HIV on 06/19/21, Cd4 is 28 and Vl 421K Pt has advanced aids with progressive dyspnea  with b/l GGO in CXR which is progressing compared to a week- no improvement with levaquin which he has been taking from last week Hypoxia  with desaturation on exercise ABG shows hypoxia  D.D - PJP  VS CMV pneumonitis VS KS BAL tests pending Blood cmv pcr qualitative positive , quantitative has been sent as if low titers does not need rx supposed to On Iv bactrim and PO prednisone 36m BID- but after 1 dose of prednisone the order was discontinued. Unclear why So restart prednisone as risk for worsening lung disease .  fungitell ( beta D glucan)-result pending CMV PCR-Pending Toxo, crypto, hepatitis profile-neg RESp panel PCR-neg    HAART will now have to be delayed   Weight loss due to AIDS   Hypoalbuminemia due to AIDS   Discussed the  management with the patient  And care team ID will follow him peripherally this weekend- call if needed

## 2021-06-29 NOTE — Assessment & Plan Note (Signed)
Estimated body mass index is 19.14 kg/m as calculated from the following:   Height as of this encounter: 5\' 6"  (1.676 m).   Weight as of this encounter: 53.8 kg.   -Dietitian consult

## 2021-06-29 NOTE — Progress Notes (Addendum)
Progress Note   Patient: Kent Page [id number] DOB: 1990-05-06 DOA: 06/26/2021     3 DOS: the patient was seen and examined on 06/29/2021   Brief hospital course: Patient is a 31 years old male with past medical history of recently diagnosed HIV was sent from ID clinic with concerns for increasing shortness of breath with hypoxia noted in the clinic with pulse ox in the 80s is in room air.  Patient was newly diagnosed of HIV/AIDs recently with a CD4 count of 29.  He was in the process of setting up treatment plan with ID   There was some concern for PJP pneumonia and the patient was sent to the hospital.  Patient states that he has been having dyspnea and shortness of breath since April which has been gradually worsening at this time.  He does have dyspnea on exertion and has mostly cough which is dry in nature with some productive cough in the morning.  Denies any fever,chills or rigor.  Denies any chest pain.  No hemoptysis.  Denies any sick contacts.  Does have a diminished appetite and has lost around 20 pounds of weight loss since April.  Denies any nausea vomiting or diarrhea.  Denies any urinary urgency frequency or dysuria.  He states that he has been smoking marijuana but denies smoking cigarettes recently.  Used to drink alcohol but has quit since March 2023.  In the ED, patient was mildly hypotensive with blood pressure of 96/77.  Pulse oximetry was however 95% on room air .  Repeated test showed WBC at 9.4 with hemoglobin of 12.2.  COVID and influenza was negative.  Lactic acid was elevated at 2.4 sodium of 135 with potassium of 3.9.  Creatinine was 1.1.  LFTs were within normal limits.  Patient received 1 L of IV fluid bolus in the ED.  Pulmonary was consulted from the ED for bronchoscopy.  Patient was then considered for admission to hospital for further evaluation and treatment.  5/17: Patient was waiting for bronchoscopy when seen today.  He was very concerned about not starting  HIV treatment early.  5/18: Patient had successful bronchoscopy and BAL was done, preliminary cultures are negative so far.  Patient developed fever at 101.3 this morning requiring Tylenol. Repeat blood cultures were done. Repeat chest x-ray with little worsening of right-sided infiltrates Per ID we will continue with Bactrim at this time and follow the cultures.  5/19: Patient remained afebrile after having fever yesterday morning.  Repeat blood cultures are negative so far.  BAL cultures are negative so far.  Fungal cultures are pending. Giving another bolus for softer blood pressure   Assessment and Plan: * Pneumonia Concern of atypical pneumonia possibly PGP pneumonia, with recent diagnosis of ARDS.  No hypoxia.  COVID-19 and influenza PCR negative.  Chest x-ray with bilateral groundglass opacities.  Respiratory viral panel negative.  Blood cultures remain negative.  Repeat blood cultures done yesterday are negative in 24 hours S/p bronchoscopy and preliminary BAL cultures are negative. -Continue with IV Bactrim -Follow-up BAL labs -Follow-up repeat blood cultures  AIDS ([place]) New diagnosis of AIDS.Absolute CD4 count of 28 with significantly elevated viral load at 421 K. from blood work 06/19/2021.  ID Dr. Delaine Lame on board.  Chlamydia and gonorrhea are negative.  RPR negative, cryptococcal antigen negative.  Hepatitis panel negative. -ID is going to start him on Biktarvy today -Monitor for any immune reconstitution syndrome  SOB (shortness of breath) Most likely secondary to pneumonia.  No hypoxia. -  Continue to monitor -Treat as above  Lactate blood increased Resolved.  Protein-calorie malnutrition, severe ([place]) Estimated body mass index is 19.14 kg/m as calculated from the following:   Height as of this encounter: _0  (1.676 m).   Weight as of this encounter: 53.8 kg.   -Dietitian consult     Subjective: Patient was resting comfortably when seen today.  No  complaints.  Physical Exam: Vitals:   06/29/21 0424 06/29/21 0425 06/29/21 0500 06/29/21 0824  BP: 103/70 103/70  (!) 83/51  Pulse: 91 89  87  Resp: 18   17  Temp: 100.3 F (37.9 C) 100.3 F (37.9 C)  98.8 F (37.1 C)  TempSrc: Oral Oral  Oral  SpO2:  97%  97%  Weight:   53.8 kg   Height:       General.  Malnourished gentleman, in no acute distress. Pulmonary.  Lungs clear bilaterally, normal respiratory effort. CV.  Regular rate and rhythm, no JVD, rub or murmur. Abdomen.  Soft, nontender, nondistended, BS positive. CNS.  Alert and oriented .  No focal neurologic deficit. Extremities.  No edema, no cyanosis, pulses intact and symmetrical. Psychiatry.  Judgment and insight appears normal.  Data Reviewed: Prior notes, labs and images reviewed  Family Communication: Discussed with patient  Disposition: Status is: Inpatient Remains inpatient appropriate because: Severity of illness   Planned Discharge Destination: Home  DVT prophylaxis.  Lovenox Time spent: 40 minutes  This record has been created using Systems analyst. Errors have been sought and corrected,but may not always be located. Such creation errors do not reflect on the standard of care.  Author: Lorella Nimrod, MD 06/29/2021 3:24 PM  For on call review [email or web address].

## 2021-06-29 NOTE — Assessment & Plan Note (Signed)
Resolved

## 2021-06-29 NOTE — Assessment & Plan Note (Signed)
New diagnosis of AIDS.Absolute CD4 count of 28 with significantly elevated viral load at 421 K. from blood work 06/19/2021.  ID Dr. Rivka Safer on board.  Chlamydia and gonorrhea are negative.  RPR negative, cryptococcal antigen negative.  Hepatitis panel negative. -ID is going to start him on Biktarvy today -Monitor for any immune reconstitution syndrome

## 2021-06-29 NOTE — Assessment & Plan Note (Signed)
New diagnosis of AIDS.Absolute CD4 count of 28 with significantly elevated viral load at 421 K. from blood work 06/19/2021.  ID Dr. Ravishankar on board.  ?Chlamydia and gonorrhea are negative.  RPR negative, cryptococcal antigen negative.  Hepatitis panel negative. ?-ID is planning to start HAART from next week ?

## 2021-06-30 ENCOUNTER — Inpatient Hospital Stay: Payer: No Typology Code available for payment source

## 2021-06-30 DIAGNOSIS — B20 Human immunodeficiency virus [HIV] disease: Secondary | ICD-10-CM | POA: Diagnosis not present

## 2021-06-30 DIAGNOSIS — B59 Pneumocystosis: Secondary | ICD-10-CM | POA: Diagnosis not present

## 2021-06-30 LAB — QUANTIFERON-TB GOLD PLUS: QuantiFERON-TB Gold Plus: NEGATIVE

## 2021-06-30 LAB — CBC
HCT: 32 % — ABNORMAL LOW (ref 39.0–52.0)
Hemoglobin: 10.7 g/dL — ABNORMAL LOW (ref 13.0–17.0)
MCH: 29 pg (ref 26.0–34.0)
MCHC: 33.4 g/dL (ref 30.0–36.0)
MCV: 86.7 fL (ref 80.0–100.0)
Platelets: 572 10*3/uL — ABNORMAL HIGH (ref 150–400)
RBC: 3.69 MIL/uL — ABNORMAL LOW (ref 4.22–5.81)
RDW: 12.3 % (ref 11.5–15.5)
WBC: 11 10*3/uL — ABNORMAL HIGH (ref 4.0–10.5)
nRBC: 0 % (ref 0.0–0.2)

## 2021-06-30 LAB — MAGNESIUM: Magnesium: 2.1 mg/dL (ref 1.7–2.4)

## 2021-06-30 LAB — CULTURE, BAL-QUANTITATIVE W GRAM STAIN
Culture: NO GROWTH
Gram Stain: NONE SEEN

## 2021-06-30 LAB — CREATININE, SERUM
Creatinine, Ser: 1.12 mg/dL (ref 0.61–1.24)
GFR, Estimated: >60 mL/min (ref >60)

## 2021-06-30 LAB — QUANTIFERON-TB GOLD PLUS (RQFGPL)
QuantiFERON Mitogen Value: 0.59 IU/mL
QuantiFERON Nil Value: 0 IU/mL
QuantiFERON TB1 Ag Value: 0.01 IU/mL
QuantiFERON TB2 Ag Value: 0.01 IU/mL

## 2021-06-30 LAB — PHOSPHORUS: Phosphorus: 3.3 mg/dL (ref 2.5–4.6)

## 2021-06-30 LAB — POTASSIUM: Potassium: 4.4 mmol/L (ref 3.5–5.1)

## 2021-06-30 MED ORDER — IOHEXOL 350 MG/ML SOLN
75.0000 mL | Freq: Once | INTRAVENOUS | Status: AC | PRN
  Administered 2021-06-30: 75 mL via INTRAVENOUS

## 2021-06-30 NOTE — Progress Notes (Addendum)
   PULMONOLOGY         Date: 06/30/2021,   MRN# [id number] Kent Page 01/23/1991     AdmissionWeight: 54.4 kg                 CurrentWeight: 53.8 kg  Referring provider: Dr. Melinda   CHIEF COMPLAINT:   Decompensated AIDS with acute hypoxemic respiratory failure   HISTORY OF PRESENT ILLNESS   31-year-old patient with newly diagnosed HIV/AIDS coming in from infectious disease due to worsening tachycardia and hypoxemia.  Recent CD4 count of 29.  Was setting up plan with infectious disease for AIDS therapy.  Had concerns for PJP pneumonia and was sent to the hospital to evaluate for this.  Reports ongoing progressive shortness of breath from April denies sick contacts does admit to productive cough denies fevers chills hemoptysis has been anorexic with over 20 pound weight loss in the last few months.  Generally smokes marijuana and cigarettes daily does use alcohol but declines if he is with alcoholism has not had seizures in the past.  In the ED was noted to be hypotensive tachycardic with hypoxemia on mild exertion mild AKI with creatinine 1.1 otherwise electrolytes are fairly preserved received resuscitative fluids pulmonary consultation for bronchoscopic airway inspection and microbiology with bronchoalveolar lavage.  06/27/21- met with patient explained planned procedure , he consents to endotracheal intubation and bronchoscopy with BAL and planned airway inspection with therapeutic aspiration of tracheobronchial tree. Patient is agreeable.  Reviewed risks/complications and benefits with patient, risks include infection, pneumothorax/pneumomediastinum which may require chest tube placement as well as overnight/prolonged hospitalization and possible mechanical ventilation. Other risks include bleeding and very rarely death.  Patient understands risks and wishes to proceed.  Additional questions were answered, and patient is aware that post procedure patient will be going home  with family and may experience cough with possible clots on expectoration as well as phlegm which may last few days as well as hoarseness of voice post intubation and mechanical ventilation.  06/28/2021-patient seen and evaluated at bedside.  Remains on room air with no shortness of breath and normoxia.  He did have a fever overnight however this has resolved and he feels asymptomatic.  Microbiology from bronchoalveolar lavage is still in process.  Patient is stable and may possibly have outpatient therapy if he continues to improve.  06/29/21- patient is stable , he is nonfebrile. He had normal well formed BM.  He is urinating well and he is eating well. He is cleared for dc home from pulmonary perspective.  Thus far nothing has grown from BAL microbiology.   06/30/21- patient with febrile episode overnight.  Remains stable though.  Microbiology unrevealing thus far. CMV pcr as below. His HIV meds are being held for now PJP workup in process. Renal function stable. CBC with mild leukocytosis and anemia.  He vomited this morning states stomach was feeling sick after meal.  He has dyspnea with mild exertion and tachycardia. Since his renal function is good and we still do not have a definitive diagnosis to explain why he is tachycaric and dyspneic on mild exertion , I will obtain CT angio PE protocol   0 Result Notes     Component Ref Range & Units 3 d ago  CMV DNA Quant Negative IU/mL 498   Comment: The quantitative range of this assay is 200 to 1 million IU/mL.  Log10 CMV Qn DNA Pl log10 IU/mL 2.697   Comment: (NOTE)  Performed At: [facility]    [address] [place], [place] [id number]       PAST MEDICAL HISTORY   Past Medical History:  Diagnosis Date   HIV (human immunodeficiency virus infection) (HCC)      SURGICAL HISTORY   History reviewed. No pertinent surgical history.   FAMILY HISTORY   History reviewed. No pertinent family history.   SOCIAL HISTORY   Social  History   Tobacco Use   Smoking status: Former    Types: Cigarettes    Passive exposure: Past   Smokeless tobacco: Never  Vaping Use   Vaping Use: Never used  Substance Use Topics   Alcohol use: Yes    Comment: daily   Drug use: Not Currently     MEDICATIONS    Home Medication:     Current Medication:  Current Facility-Administered Medications:    0.9 %  sodium chloride infusion, 250 mL, Intravenous, PRN, Pokhrel, Laxman, MD, Stopped at 06/27/21 1800   0.9 %  sodium chloride infusion, 250 mL, Intravenous, Continuous, Aleskerov, Fuad, MD, Stopped at 06/29/21 0516   acetaminophen (TYLENOL) tablet 650 mg, 650 mg, Oral, Q6H PRN, 650 mg at 06/29/21 2037 **OR** acetaminophen (TYLENOL) suppository 650 mg, 650 mg, Rectal, Q6H PRN, Pokhrel, Laxman, MD   albuterol (PROVENTIL) (2.5 MG/3ML) 0.083% nebulizer solution 2.5 mg, 2.5 mg, Nebulization, Q4H PRN, Pokhrel, Laxman, MD   [START ON 07/01/2021] bictegravir-emtricitabine-tenofovir AF (BIKTARVY) 50-200-25 MG per tablet 1 tablet, 1 tablet, Oral, Daily, Ravishankar, Jayashree, MD   Chlorhexidine Gluconate Cloth 2 % PADS 6 each, 6 each, Topical, Daily, Amin, Sumayya, MD, 6 each at 06/30/21 1506   enoxaparin (LOVENOX) injection 40 mg, 40 mg, Subcutaneous, Q24H, Pokhrel, Laxman, MD, 40 mg at 06/29/21 2037   feeding supplement (ENSURE ENLIVE / ENSURE PLUS) liquid 237 mL, 237 mL, Oral, TID BM, Amin, Sumayya, MD, 237 mL at 06/29/21 0854   fentaNYL 2500mcg in NS 250mL (10mcg/ml) infusion-PREMIX, 0-200 mcg/hr, Intravenous, Continuous, Aleskerov, Fuad, MD, Stopped at 06/27/21 1254   multivitamin with minerals tablet 1 tablet, 1 tablet, Oral, Daily, Amin, Sumayya, MD, 1 tablet at 06/29/21 0853   ondansetron (ZOFRAN) tablet 4 mg, 4 mg, Oral, Q6H PRN **OR** ondansetron (ZOFRAN) injection 4 mg, 4 mg, Intravenous, Q6H PRN, Pokhrel, Laxman, MD, 4 mg at 06/30/21 0734   pantoprazole (PROTONIX) EC tablet 40 mg, 40 mg, Oral, Daily, Pokhrel, Laxman, MD, 40 mg at  06/30/21 0911   predniSONE (DELTASONE) tablet 40 mg, 40 mg, Oral, BID WC, 40 mg at 06/30/21 0911 **FOLLOWED BY** [START ON 07/04/2021] predniSONE (DELTASONE) tablet 40 mg, 40 mg, Oral, Q breakfast, Ravishankar, Jayashree, MD   senna-docusate (Senokot-S) tablet 1 tablet, 1 tablet, Oral, QHS PRN, Pokhrel, Laxman, MD   sodium chloride flush (NS) 0.9 % injection 3 mL, 3 mL, Intravenous, Q12H, Pokhrel, Laxman, MD, 3 mL at 06/30/21 0911   sodium chloride flush (NS) 0.9 % injection 3 mL, 3 mL, Intravenous, PRN, Pokhrel, Laxman, MD   sulfamethoxazole-trimethoprim (BACTRIM) 320 mg in dextrose 5 % 500 mL IVPB, 320 mg, Intravenous, Q8H, Zeigler, Dustin G, RPH, Last Rate: 346.7 mL/hr at 06/30/21 1502, 320 mg at 06/30/21 1502    ALLERGIES   Patient has no known allergies.     REVIEW OF SYSTEMS    Review of Systems:  Gen:  Denies  fever, sweats, chills weigh loss  HEENT: Denies blurred vision, double vision, ear pain, eye pain, hearing loss, nose bleeds, sore throat Cardiac:  No dizziness, chest pain or heaviness, chest tightness,edema Resp:   reports dyspnea chronically    Gi: Denies swallowing difficulty, stomach pain, nausea or vomiting, diarrhea, constipation, bowel incontinence Gu:  Denies bladder incontinence, burning urine Ext:   Denies Joint pain, stiffness or swelling Skin: Denies  skin rash, easy bruising or bleeding or hives Endoc:  Denies polyuria, polydipsia , polyphagia or weight change Psych:   Denies depression, insomnia or hallucinations   Other:  All other systems negative   VS: BP 109/71 (BP Location: Right Arm)   Pulse 81   Temp 98.4 F (36.9 C) (Oral)   Resp 18   Ht 5' 6" (1.676 m)   Wt 53.8 kg   SpO2 96%   BMI 19.14 kg/m      PHYSICAL EXAM    GENERAL:NAD, no fevers, chills, no weakness no fatigue HEAD: Normocephalic, atraumatic.  EYES: Pupils equal, round, reactive to light. Extraocular muscles intact. No scleral icterus.  MOUTH: Moist mucosal membrane.  Dentition intact. No abscess noted.  EAR, NOSE, THROAT: Clear without exudates. No external lesions.  NECK: Supple. No thyromegaly. No nodules. No JVD.  PULMONARY: decreased breath sounds with mild rhonchi worse at bases bilaterally.  CARDIOVASCULAR: S1 and S2. Regular rate and rhythm. No murmurs, rubs, or gallops. No edema. Pedal pulses 2+ bilaterally.  GASTROINTESTINAL: Soft, nontender, nondistended. No masses. Positive bowel sounds. No hepatosplenomegaly.  MUSCULOSKELETAL: No swelling, clubbing, or edema. Range of motion full in all extremities.  NEUROLOGIC: Cranial nerves II through XII are intact. No gross focal neurological deficits. Sensation intact. Reflexes intact.  SKIN: No ulceration, lesions, rashes, or cyanosis. Skin warm and dry. Turgor intact.  PSYCHIATRIC: Mood, affect within normal limits. The patient is awake, alert and oriented x 3. Insight, judgment intact.       IMAGING   @IMAGES@   ASSESSMENT/PLAN   Acute hypoxemic respiratory failure-resolved Patient with active AIDS and is under the care of infectious disease team in process of receiving therapy -Status post ID evaluation-appreciate input -There is recommendation for bronchoscopic evaluation with microbiology to be sent for KS/PJP/CMV -Patient reports some improvement since hospitalization and having resuscitative fluids #Patient is currently normoxic on room air and reports being close to baseline  Centrilobular emphysema -Active tobacco and THC abuse.  Smoking cessation counseling provided      AIDS CD4 less than 50 -Infectious disease on case-appreciate input    Thank you for allowing me to participate in the care of this patient.   Patient/Family are satisfied with care plan and all questions have been answered.    Provider disclosure: Patient with at least one acute or chronic illness or injury that poses a threat to life or bodily function and is being managed actively during this encounter.   All of the below services have been performed independently by signing provider:  review of prior documentation from internal and or external health records.  Review of previous and current lab results.  Interview and comprehensive assessment during patient visit today. Review of current and previous chest radiographs/CT scans. Discussion of management and test interpretation with health care team and patient/family.   This document was prepared using Dragon voice recognition software and may include unintentional dictation errors.     Fuad Aleskerov, M.D.  Division of Pulmonary & Critical Care Medicine          

## 2021-06-30 NOTE — Progress Notes (Signed)
Progress Note   Patient: Kent Page SHF:[id number] DOB: [birth year] DOA: 06/26/2021     4 DOS: the patient was seen and examined on 06/30/2021   Brief hospital course: Patient is a 31 years old male with past medical history of recently diagnosed HIV was sent from ID clinic with concerns for increasing shortness of breath with hypoxia noted in the clinic with pulse ox in the 80s is in room air.  Patient was newly diagnosed of HIV/AIDs recently with a CD4 count of 29.  He was in the process of setting up treatment plan with ID   There was some concern for PJP pneumonia and the patient was sent to the hospital.  Patient states that he has been having dyspnea and shortness of breath since April which has been gradually worsening at this time.  He does have dyspnea on exertion and has mostly cough which is dry in nature with some productive cough in the morning.  Denies any fever,chills or rigor.  Denies any chest pain.  No hemoptysis.  Denies any sick contacts.  Does have a diminished appetite and has lost around 20 pounds of weight loss since April.  Denies any nausea vomiting or diarrhea.  Denies any urinary urgency frequency or dysuria.  He states that he has been smoking marijuana but denies smoking cigarettes recently.  Used to drink alcohol but has quit since March 2023.  In the ED, patient was mildly hypotensive with blood pressure of 96/77.  Pulse oximetry was however 95% on room air .  Repeated test showed WBC at 9.4 with hemoglobin of 12.2.  COVID and influenza was negative.  Lactic acid was elevated at 2.4 sodium of 135 with potassium of 3.9.  Creatinine was 1.1.  LFTs were within normal limits.  Patient received 1 L of IV fluid bolus in the ED.  Pulmonary was consulted from the ED for bronchoscopy.  Patient was then considered for admission to hospital for further evaluation and treatment.  5/17: Patient was waiting for bronchoscopy when seen today.  He was very concerned about not starting  HIV treatment early.  5/18: Patient had successful bronchoscopy and BAL was done, preliminary cultures are negative so far.  Patient developed fever at 101.3 this morning requiring Tylenol. Repeat blood cultures were done. Repeat chest x-ray with little worsening of right-sided infiltrates Per ID we will continue with Bactrim at this time and follow the cultures.  5/19: Patient remained afebrile after having fever yesterday morning.  Repeat blood cultures are negative so far.  BAL cultures are negative so far.  Fungal cultures are pending. Giving another bolus for softer blood pressure.  5/20: Patient developed fever up to 101.3 last night around 8 PM.  Remained febrile this morning at 100.2.  HAART got delayed till Sunday as he needs to be on prednisone for at least 48 hours before starting antiviral medications for HIV to prevent immune reconstitution syndrome.  He was started on prednisone yesterday.  CBC with mild leukocytosis now, hemoglobin down at 10.7 with stable thrombocytosis at 572.  Blood cultures remain negative.  BAL culture remain negative.  Fungal cultures negative.  CMV, pneumocystic, toxo, RPR, hepatitis panel, cryptococcal labs are negative.   Assessment and Plan: * Pneumonia Concern of atypical pneumonia possibly PGP pneumonia, with recent diagnosis of ARDS.  No hypoxia.  COVID-19 and influenza PCR negative.  Chest x-ray with bilateral groundglass opacities.  Respiratory viral panel negative.  Blood cultures done twice remain negative.  CMV, toxo and cryptococcal  labs are negative.  Fungal cultures negative.  Continue to have fever. S/p bronchoscopy and preliminary BAL cultures are negative. -Continue with IV Bactrim   AIDS ([place]) New diagnosis of AIDS.Absolute CD4 count of 28 with significantly elevated viral load at 421 K. from blood work 06/19/2021.  ID Dr. Delaine Lame on board.  Chlamydia and gonorrhea are negative.  RPR negative, cryptococcal antigen negative.  Hepatitis  panel negative. -HAART treatment with [name] got delayed for 2 days as ID wants him to be on prednisone for at least 48 hours before starting the treatment to prevent immune reconstitution syndrome -Most likely will start on Sunday or Monday -Continue with prednisone at this time  SOB (shortness of breath) Most likely secondary to pneumonia.  No hypoxia. -Continue to monitor -Treat as above  Lactate blood increased Resolved.  Protein-calorie malnutrition, severe ([place]) Estimated body mass index is 19.14 kg/m as calculated from the following:   Height as of this encounter: _0  (1.676 m).   Weight as of this encounter: 53.8 kg.   -Dietitian consult     Subjective: Patient was feeling weak and little dizzy when seen today.  Continued to have low-grade fever.  Physical Exam: Vitals:   06/30/21 0004 06/30/21 0410 06/30/21 0744 06/30/21 1512  BP: 104/63 112/70 113/70 109/71  Pulse: 74 98 97 81  Resp: _1 Temp: 98.8 F (37.1 C) 99.4 F (37.4 C) 100.2 F (37.9 C) 98.4 F (36.9 C)  TempSrc: Oral Oral Oral Oral  SpO2: 91% 96% 94% 96%  Weight:      Height:       General.  Malnourished gentleman, in no acute distress. Pulmonary.  Lungs clear bilaterally, normal respiratory effort. CV.  Regular rate and rhythm, no JVD, rub or murmur. Abdomen.  Soft, nontender, nondistended, BS positive. CNS.  Alert and oriented .  No focal neurologic deficit. Extremities.  No edema, no cyanosis, pulses intact and symmetrical. Psychiatry.  Judgment and insight appears normal.  Data Reviewed: Prior notes and labs reviewed  Family Communication: Discussed with patient, he will discuss with family himself  Disposition: Status is: Inpatient Remains inpatient appropriate because: Severity of illness   Planned Discharge Destination: Home  DVT prophylaxis.  Lovenox Time spent: 45 minutes  This record has been created using Systems analyst. Errors have been  sought and corrected,but may not always be located. Such creation errors do not reflect on the standard of care.  Author: Lorella Nimrod, MD 06/30/2021 4:03 PM  For on call review [email or web address].

## 2021-06-30 NOTE — Assessment & Plan Note (Signed)
Concern of atypical pneumonia possibly PGP pneumonia, with recent diagnosis of ARDS.  No hypoxia.  COVID-19 and influenza PCR negative.  Chest x-ray with bilateral groundglass opacities.  Respiratory viral panel negative.  Blood cultures done twice remain negative.  CMV, toxo and cryptococcal labs are negative.  Fungal cultures negative.  Continue to have fever. S/p bronchoscopy and preliminary BAL cultures are negative. -Continue with IV Bactrim

## 2021-06-30 NOTE — Progress Notes (Signed)
   06/29/21 2008  Assess: MEWS Score  Temp (!) 101.3 F (38.5 C) (Reported to RN Nurse.)  BP 96/65  Pulse Rate 94  ECG Heart Rate 94  Resp 20  Level of Consciousness Alert  SpO2 97 %  O2 Device Room Air  Patient Activity (if Appropriate) In bed  Assess: MEWS Score  MEWS Temp 1  MEWS Systolic 1  MEWS Pulse 0  MEWS RR 0  MEWS LOC 0  MEWS Score 2  MEWS Score Color Yellow  Assess: if the MEWS score is Yellow or Red  Were vital signs taken at a resting state? Yes  Focused Assessment No change from prior assessment  Does the patient meet 2 or more of the SIRS criteria? Yes  Does the patient have a confirmed or suspected source of infection? Yes  Provider and Rapid Response Notified? Yes  MEWS guidelines implemented *See Row Information* Yes  Treat  MEWS Interventions Administered scheduled meds/treatments  Pain Scale 0-10  Pain Score 5  Pain Location Head  Pain Orientation Mid  Pain Descriptors / Indicators Aching  Pain Frequency Rarely  Pain Onset Gradual  Patients Stated Pain Goal 0  Pain Intervention(s) Medication (See eMAR)  Multiple Pain Sites No  Breathing 0  Negative Vocalization 0  Facial Expression 0  Body Language 0  Consolability 0  PAINAD Score 0  Take Vital Signs  Increase Vital Sign Frequency  Yellow: Q 2hr X 2 then Q 4hr X 2, if remains yellow, continue Q 4hrs  Escalate  MEWS: Escalate Yellow: discuss with charge nurse/RN and consider discussing with provider and RRT  Notify: Charge Nurse/RN  Name of Charge Nurse/RN Notified Raynelle Fanning, RN  Date Charge Nurse/RN Notified 06/29/21  Time Charge Nurse/RN Notified 2024  Notify: Provider  Provider Name/Title Jon Billings, NP  Date Provider Notified 06/29/21  Time Provider Notified 2025  Method of Notification Page  Notification Reason Change in status  Provider response No new orders  Date of Provider Response 06/29/21  Time of Provider Response 2026  Document  Patient Outcome Stabilized after interventions   Progress note created (see row info) Yes  Assess: SIRS CRITERIA  SIRS Temperature  1  SIRS Pulse 1  SIRS Respirations  0  SIRS WBC 0  SIRS Score Sum  2

## 2021-07-01 DIAGNOSIS — R0602 Shortness of breath: Secondary | ICD-10-CM | POA: Diagnosis not present

## 2021-07-01 DIAGNOSIS — B59 Pneumocystosis: Secondary | ICD-10-CM | POA: Diagnosis not present

## 2021-07-01 DIAGNOSIS — B20 Human immunodeficiency virus [HIV] disease: Secondary | ICD-10-CM | POA: Diagnosis not present

## 2021-07-01 LAB — PNEUMOCYSTIS PCR: Result Pneumocystis PCR: POSITIVE — AB

## 2021-07-01 LAB — CULTURE, BLOOD (ROUTINE X 2)
Culture: NO GROWTH
Culture: NO GROWTH

## 2021-07-01 LAB — MAGNESIUM: Magnesium: 2.3 mg/dL (ref 1.7–2.4)

## 2021-07-01 LAB — PHOSPHORUS: Phosphorus: 3 mg/dL (ref 2.5–4.6)

## 2021-07-01 NOTE — Progress Notes (Signed)
PULMONOLOGY         Date: 07/01/2021,   MRN# [id number] Kent Page 31-Oct-1990     AdmissionWeight: 54.4 kg                 CurrentWeight: 53.8 kg  Referring provider: Dr. 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  Recent CD4 count of 29.  Was setting up plan with infectious disease for AIDS therapy.  Had concerns for PJP pneumonia and was sent to the hospital to evaluate for this.  Reports ongoing progressive shortness of breath from April denies sick contacts does admit to productive cough denies fevers chills hemoptysis has been anorexic with over 20 pound weight loss in the last few months.  Generally smokes marijuana and cigarettes daily does use alcohol but declines if he is with alcoholism has not had seizures in the past.  In the ED was noted to be hypotensive tachycardic with hypoxemia on mild exertion mild AKI with creatinine 1.1 otherwise electrolytes are fairly preserved received resuscitative fluids pulmonary consultation for bronchoscopic airway inspection and microbiology with bronchoalveolar lavage.  06/27/21- met with patient explained planned procedure , he consents to endotracheal intubation and bronchoscopy with BAL and planned airway inspection with therapeutic aspiration of tracheobronchial tree. Patient is agreeable.  Reviewed risks/complications and benefits with patient, risks include infection, pneumothorax/pneumomediastinum which may require chest tube placement as well as overnight/prolonged hospitalization and possible mechanical ventilation. Other risks include bleeding and very rarely death.  Patient understands risks and wishes to proceed.  Additional questions were answered, and patient is aware that post procedure patient will be going home  with family and may experience cough with possible clots on expectoration as well as phlegm which may last few days as well as hoarseness of voice post intubation and mechanical ventilation.  06/28/2021-patient seen and evaluated at bedside.  Remains on room air with no shortness of breath and normoxia.  He did have a fever overnight however this has resolved and he feels asymptomatic.  Microbiology from bronchoalveolar lavage is still in process.  Patient is stable and may possibly have outpatient therapy if he continues to improve.  06/29/21- patient is stable , he is nonfebrile. He had normal well formed BM.  He is urinating well and he is eating well. He is cleared for dc home from pulmonary perspective.  Thus far nothing has grown from BAL microbiology.   06/30/21- patient with febrile episode overnight.  Remains stable though.  Microbiology unrevealing thus far. CMV pcr as below. His HIV meds are being held for now PJP workup in process. Renal function stable. CBC with mild leukocytosis and anemia.  He vomited this morning states stomach was feeling sick after meal.  He has dyspnea with mild exertion and tachycardia. Since his renal function is good and we still do not have a definitive diagnosis to explain why he is tachycaric and dyspneic on mild exertion , I will obtain CT angio PE protocol   0 Result Notes     Component Ref Range & Units 3 d ago  CMV DNA Quant Negative IU/mL 498   Comment: The quantitative range of this assay is 200 to 1 million IU/mL.  Log10 CMV Qn DNA Pl log10 IU/mL 2.697   Comment: (NOTE)  Performed At: [facility]  [place], [place] [id number]       PAST MEDICAL HISTORY   Past Medical History:  Diagnosis Date   HIV (human immunodeficiency virus infection) ([place])      SURGICAL HISTORY   History reviewed. No pertinent surgical history.   FAMILY HISTORY   History reviewed. No pertinent family history.   SOCIAL HISTORY   Social  History   Tobacco Use   Smoking status: Former    Types: Cigarettes    Passive exposure: Past   Smokeless tobacco: Never  Vaping Use   Vaping Use: Never used  Substance Use Topics   Alcohol use: Yes    Comment: daily   Drug use: Not Currently     MEDICATIONS    Home Medication:     Current Medication:  Current Facility-Administered Medications:    0.9 %  sodium chloride infusion, 250 mL, Intravenous, PRN, Pokhrel, Laxman, MD, Stopped at 06/27/21 1800   0.9 %  sodium chloride infusion, 250 mL, Intravenous, Continuous, Ottie Glazier, MD, Stopped at 06/29/21 0516   acetaminophen (TYLENOL) tablet 650 mg, 650 mg, Oral, Q6H PRN, 650 mg at 06/29/21 2037 **OR** acetaminophen (TYLENOL) suppository 650 mg, 650 mg, Rectal, Q6H PRN, Pokhrel, Laxman, MD   albuterol (PROVENTIL) (2.5 MG/3ML) 0.083% nebulizer solution 2.5 mg, 2.5 mg, Nebulization, Q4H PRN, Pokhrel, Laxman, MD   bictegravir-emtricitabine-tenofovir AF (BIKTARVY) 50-200-25 MG per tablet 1 tablet, 1 tablet, Oral, Daily, Ravishankar, Joellyn Quails, MD, 1 tablet at 07/01/21 0947   enoxaparin (LOVENOX) injection 40 mg, 40 mg, Subcutaneous, Q24H, Pokhrel, Laxman, MD, 40 mg at 06/30/21 2237   fentaNYL 2511mg in NS 2529m(1031mml) infusion-PREMIX, 0-200 mcg/hr, Intravenous, Continuous, AleOttie GlazierD, Stopped at 06/27/21 1254   multivitamin with minerals tablet 1 tablet, 1 tablet, Oral, Daily, AmiLorella NimrodD, 1 tablet at 06/30/21 2237   ondansetron (ZOFRAN) tablet 4 mg, 4 mg, Oral, Q6H PRN **OR** ondansetron (ZOFRAN) injection 4 mg, 4 mg, Intravenous, Q6H PRN, Pokhrel, Laxman, MD, 4 mg at 06/30/21 0734   pantoprazole (PROTONIX) EC tablet 40 mg, 40 mg, Oral, Daily, Pokhrel, Laxman, MD, 40 mg at 07/01/21 0946   predniSONE (DELTASONE) tablet 40 mg, 40 mg, Oral, BID WC, 40 mg at 07/01/21 0946 **FOLLOWED BY** [START ON 07/04/2021] predniSONE (DELTASONE) tablet 40 mg, 40 mg, Oral, Q breakfast, Ravishankar, Jayashree, MD   senna-docusate  (Senokot-S) tablet 1 tablet, 1 tablet, Oral, QHS PRN, Pokhrel, Laxman, MD   sodium chloride flush (NS) 0.9 % injection 3 mL, 3 mL, Intravenous, Q12H, Pokhrel, Laxman, MD, 3 mL at 07/01/21 0950   sodium chloride flush (NS) 0.9 % injection 3 mL, 3 mL, Intravenous, PRN, Pokhrel, Laxman, MD   sulfamethoxazole-trimethoprim (BACTRIM) 320 mg in dextrose 5 % 500 mL IVPB, 320 mg, Intravenous, Q8H, Zeigler, Dustin G, RPH, Last Rate: 346.7 mL/hr at 07/01/21 1312, 320 mg at 07/01/21 1312    ALLERGIES   Patient has no known allergies.     REVIEW OF SYSTEMS    Review of Systems:  Gen:  Denies  fever, sweats, chills weigh loss  HEENT: Denies blurred vision, double vision, ear pain, eye pain, hearing loss, nose bleeds, sore throat Cardiac:  No dizziness, chest pain or heaviness, chest tightness,edema Resp:   reports dyspnea chronically  Gi: Denies swallowing difficulty, stomach pain, nausea or vomiting, diarrhea, constipation, bowel incontinence Gu:  Denies bladder incontinence, burning urine Ext:   Denies Joint pain, stiffness or swelling Skin: Denies  skin rash, easy bruising or bleeding or hives Endoc:  Denies polyuria,  polydipsia , polyphagia or weight change Psych:   Denies depression, insomnia or hallucinations   Other:  All other systems negative   VS: BP 100/71   Pulse 86   Temp (!) 97.3 F (36.3 C)   Resp 16   Ht _0  (1.676 m)   Wt 53.8 kg   SpO2 96%   BMI 19.14 kg/m      PHYSICAL EXAM    GENERAL:NAD, no fevers, chills, no weakness no fatigue HEAD: Normocephalic, atraumatic.  EYES: Pupils equal, round, reactive to light. Extraocular muscles intact. No scleral icterus.  MOUTH: Moist mucosal membrane. Dentition intact. No abscess noted.  EAR, NOSE, THROAT: Clear without exudates. No external lesions.  NECK: Supple. No thyromegaly. No nodules. No JVD.  PULMONARY: decreased breath sounds with mild rhonchi worse at bases bilaterally.  CARDIOVASCULAR: S1 and S2. Regular  rate and rhythm. No murmurs, rubs, or gallops. No edema. Pedal pulses 2+ bilaterally.  GASTROINTESTINAL: Soft, nontender, nondistended. No masses. Positive bowel sounds. No hepatosplenomegaly.  MUSCULOSKELETAL: No swelling, clubbing, or edema. Range of motion full in all extremities.  NEUROLOGIC: Cranial nerves II through XII are intact. No gross focal neurological deficits. Sensation intact. Reflexes intact.  SKIN: No ulceration, lesions, rashes, or cyanosis. Skin warm and dry. Turgor intact.  PSYCHIATRIC: Mood, affect within normal limits. The patient is awake, alert and oriented x 3. Insight, judgment intact.       IMAGING     ASSESSMENT/PLAN   Acute hypoxemic respiratory failure- - due to bilateral pneumonia Patient with active AIDS and is under the care of infectious disease team in process of receiving therapy -Status post ID evaluation-appreciate input -There is recommendation for bronchoscopic evaluation with microbiology to be sent for KS/PJP/CMV -Patient reports some improvement since hospitalization and having resuscitative fluids #Patient is currently normoxic on room air and reports being close to baseline    Centrilobular emphysema -Active tobacco and THC abuse.  Smoking cessation counseling provided      AIDS CD4 less than 50 -Infectious disease on case-appreciate input    Thank you for allowing me to participate in the care of this patient.   Patient/Family are satisfied with care plan and all questions have been answered.    Provider disclosure: Patient with at least one acute or chronic illness or injury that poses a threat to life or bodily function and is being managed actively during this encounter.  All of the below services have been performed independently by signing provider:  review of prior documentation from internal and or external health records.  Review of previous and current lab results.  Interview and comprehensive assessment during patient  visit today. Review of current and previous chest radiographs/CT scans. Discussion of management and test interpretation with health care team and patient/family.   This document was prepared using Dragon voice recognition software and may include unintentional dictation errors.     Ottie Glazier, M.D.  Division of Pulmonary & Critical Care Medicine

## 2021-07-01 NOTE — Assessment & Plan Note (Signed)
Most likely secondary to pneumonia.  No hypoxia.  CTA was done for concern of tachypnea and tachycardia with minor exertion and it was negative for PE but did show bilateral diffuse opacities which can represent edema, pneumonia or ARDS. -Continue to monitor -Treat as above

## 2021-07-01 NOTE — Assessment & Plan Note (Addendum)
New diagnosis of AIDS.Absolute CD4 count of 28 with significantly elevated viral load at 421 K. from blood work 06/19/2021.  ID Dr. Rivka Safer on board.  Chlamydia and gonorrhea are negative.  RPR negative, cryptococcal antigen negative.  Hepatitis panel negative.  Patient received prednisone past 2 days -HAART treatment with Sus[facility] Borders was started today. -Continue with prednisone at this time

## 2021-07-01 NOTE — Assessment & Plan Note (Signed)
Concern of atypical pneumonia possibly PGP pneumonia, with recent diagnosis of ARDS.  No hypoxia.  COVID-19 and influenza PCR negative.  Chest x-ray with bilateral groundglass opacities.  Respiratory viral panel negative.  Blood cultures done twice remain negative.  CMV, toxo and cryptococcal labs are negative.  Fungal cultures negative.  Continue to have fever. S/p bronchoscopy and preliminary BAL cultures are negative. -Continue with IV Bactrim

## 2021-07-01 NOTE — Progress Notes (Signed)
Progress Note   Patient: Kent Page OAC:[id number] DOB: [birth year] DOA: 06/26/2021     5 DOS: the patient was seen and examined on 07/01/2021   Brief hospital course: Patient is a 31 years old male with past medical history of recently diagnosed HIV was sent from ID clinic with concerns for increasing shortness of breath with hypoxia noted in the clinic with pulse ox in the 80s is in room air.  Patient was newly diagnosed of HIV/AIDs recently with a CD4 count of 29.  He was in the process of setting up treatment plan with ID   There was some concern for PJP pneumonia and the patient was sent to the hospital.  Patient states that he has been having dyspnea and shortness of breath since April which has been gradually worsening at this time.  He does have dyspnea on exertion and has mostly cough which is dry in nature with some productive cough in the morning.  Denies any fever,chills or rigor.  Denies any chest pain.  No hemoptysis.  Denies any sick contacts.  Does have a diminished appetite and has lost around 20 pounds of weight loss since April.  Denies any nausea vomiting or diarrhea.  Denies any urinary urgency frequency or dysuria.  He states that he has been smoking marijuana but denies smoking cigarettes recently.  Used to drink alcohol but has quit since March 2023.  In the ED, patient was mildly hypotensive with blood pressure of 96/77.  Pulse oximetry was however 95% on room air .  Repeated test showed WBC at 9.4 with hemoglobin of 12.2.  COVID and influenza was negative.  Lactic acid was elevated at 2.4 sodium of 135 with potassium of 3.9.  Creatinine was 1.1.  LFTs were within normal limits.  Patient received 1 L of IV fluid bolus in the ED.  Pulmonary was consulted from the ED for bronchoscopy.  Patient was then considered for admission to hospital for further evaluation and treatment.  5/17: Patient was waiting for bronchoscopy when seen today.  He was very concerned about not starting  HIV treatment early.  5/18: Patient had successful bronchoscopy and BAL was done, preliminary cultures are negative so far.  Patient developed fever at 101.3 this morning requiring Tylenol. Repeat blood cultures were done. Repeat chest x-ray with little worsening of right-sided infiltrates Per ID we will continue with Bactrim at this time and follow the cultures.  5/19: Patient remained afebrile after having fever yesterday morning.  Repeat blood cultures are negative so far.  BAL cultures are negative so far.  Fungal cultures are pending. Giving another bolus for softer blood pressure.  5/20: Patient developed fever up to 101.3 last night around 8 PM.  Remained febrile this morning at 100.2.  HAART got delayed till Sunday as he needs to be on prednisone for at least 48 hours before starting antiviral medications for HIV to prevent immune reconstitution syndrome.  He was started on prednisone yesterday.  CBC with mild leukocytosis now, hemoglobin down at 10.7 with stable thrombocytosis at 572.  Blood cultures remain negative.  BAL culture remain negative.  Fungal cultures negative.  CMV, pneumocystic, toxo, RPR, hepatitis panel, cryptococcal labs are negative.  5/21: Afebrile this morning, last recorded fever of 100.2 yesterday around 8 PM.  Pulmonary did CTA to rule out PE due to tachycardia and tachypnea with minor exertion.  It was negative for PE and did show diffuse bilateral groundglass opacities which may represent edema, pneumonia or ARDS.  Patient  is being started on HAART with Biktarvy today.    Assessment and Plan: * Pneumonia Concern of atypical pneumonia possibly PGP pneumonia, with recent diagnosis of ARDS.  No hypoxia.  COVID-19 and influenza PCR negative.  Chest x-ray with bilateral groundglass opacities.  Respiratory viral panel negative.  Blood cultures done twice remain negative.  CMV, toxo and cryptococcal labs are negative.  Fungal cultures negative.  Continue to have  fever. S/p bronchoscopy and preliminary BAL cultures are negative. -Continue with IV Bactrim   AIDS ([place] Page) New diagnosis of AIDS.Absolute CD4 count of 28 with significantly elevated viral load at 421 K. from blood work 06/19/2021.  ID Dr. Delaine Lame on board.  Chlamydia and gonorrhea are negative.  RPR negative, cryptococcal antigen negative.  Hepatitis panel negative.  Patient received prednisone past 2 days -HAART treatment with [name] was started today. -Continue with prednisone at this time  SOB (shortness of breath) Most likely secondary to pneumonia.  No hypoxia.  CTA was done for concern of tachypnea and tachycardia with minor exertion and it was negative for PE but did show bilateral diffuse opacities which can represent edema, pneumonia or ARDS. -Continue to monitor -Treat as above  Lactate blood increased Resolved.  Protein-calorie malnutrition, severe ([place]) Estimated body mass index is 19.14 kg/m as calculated from the following:   Height as of this encounter: _0  (1.676 m).   Weight as of this encounter: 53.8 kg.   -Dietitian consult     Subjective: Patient was seen and examined today.  Still having some tachypnea with ambulation otherwise stable.  He was very anxious regarding his prognosis and the start of HIV meds.  No more nausea or vomiting.  Able to tolerate his diet.  Physical Exam: Vitals:   06/30/21 1512 06/30/21 1941 07/01/21 0432 07/01/21 0756  BP: 109/71 110/74 101/78 104/70  Pulse: 81 93 70 81  Resp: _1 Temp: 98.4 F (36.9 C) 98.1 F (36.7 C) 98 F (36.7 C) 98.1 F (36.7 C)  TempSrc: Oral Oral Oral Oral  SpO2: 96% 97% 97% 96%  Weight:      Height:       General.  Malnourished gentleman, in no acute distress. Pulmonary.  Lungs clear bilaterally, normal respiratory effort. CV.  Regular rate and rhythm, no JVD, rub or murmur. Abdomen.  Soft, nontender, nondistended, BS positive. CNS.  Alert and oriented .  No focal neurologic  deficit. Extremities.  No edema, no cyanosis, pulses intact and symmetrical. Psychiatry.  Judgment and insight appears normal.  Data Reviewed: Prior notes, labs and images reviewed  Family Communication: Discussed with patient, he will communicate with family himself  Disposition: Status is: Inpatient Remains inpatient appropriate because: Severity of illness   Planned Discharge Destination: Home  DVT prophylaxis.  Lovenox Time spent: 50 minutes  This record has been created using Systems analyst. Errors have been sought and corrected,but may not always be located. Such creation errors do not reflect on the standard of care.  Author: Lorella Nimrod, MD 07/01/2021 11:42 AM  For on call review [email or web address].

## 2021-07-02 ENCOUNTER — Other Ambulatory Visit (HOSPITAL_COMMUNITY): Payer: Self-pay

## 2021-07-02 DIAGNOSIS — B59 Pneumocystosis: Secondary | ICD-10-CM | POA: Diagnosis not present

## 2021-07-02 DIAGNOSIS — B20 Human immunodeficiency virus [HIV] disease: Secondary | ICD-10-CM | POA: Diagnosis not present

## 2021-07-02 LAB — ANAEROBIC CULTURE W GRAM STAIN

## 2021-07-02 LAB — BASIC METABOLIC PANEL
Anion gap: 11 (ref 5–15)
BUN: 13 mg/dL (ref 6–20)
CO2: 19 mmol/L — ABNORMAL LOW (ref 22–32)
Calcium: 8.4 mg/dL — ABNORMAL LOW (ref 8.9–10.3)
Chloride: 103 mmol/L (ref 98–111)
Creatinine, Ser: 1.04 mg/dL (ref 0.61–1.24)
GFR, Estimated: >60 mL/min (ref >60)
Glucose, Bld: 163 mg/dL — ABNORMAL HIGH (ref 70–99)
Potassium: 4.7 mmol/L (ref 3.5–5.1)
Sodium: 133 mmol/L — ABNORMAL LOW (ref 135–145)

## 2021-07-02 LAB — HIV GENOSURE REFLEX - HIVGTY - ELECTRONIC RECORD

## 2021-07-02 LAB — GENOSURE INTEGRASE HIV EDI: HIV Genosure Integrase PDF 2: 1

## 2021-07-02 LAB — CMV DNA, QUANTITATIVE, PCR
CMV DNA Quant: 1050 IU/mL
Log10 CMV Qn DNA Pl: 3.021 log10 IU/mL

## 2021-07-02 LAB — ASPERGILLUS ANTIGEN, BAL/SERUM: Aspergillus Ag, BAL/Serum: 0.06 Index (ref 0.00–0.49)

## 2021-07-02 LAB — HLA B*5701: HLA B 5701: NEGATIVE

## 2021-07-02 MED ORDER — POLYETHYLENE GLYCOL 3350 17 G PO PACK
17.0000 g | PACK | Freq: Every day | ORAL | Status: DC
  Administered 2021-07-02 – 2021-07-04 (×3): 17 g via ORAL
  Filled 2021-07-02 (×3): qty 1

## 2021-07-02 NOTE — Progress Notes (Signed)
PULMONOLOGY         Date: 07/02/2021,   MRN# [id number] Kent Page [birth year]     AdmissionWeight: 54.4 kg                 CurrentWeight: 51.4 kg  Referring provider: Dr. 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  Recent CD4 count of 29.  Was setting up plan with infectious disease for AIDS therapy.  Had concerns for PJP pneumonia and was sent to the hospital to evaluate for this.  Reports ongoing progressive shortness of breath from April denies sick contacts does admit to productive cough denies fevers chills hemoptysis has been anorexic with over 20 pound weight loss in the last few months.  Generally smokes marijuana and cigarettes daily does use alcohol but declines if he is with alcoholism has not had seizures in the past.  In the ED was noted to be hypotensive tachycardic with hypoxemia on mild exertion mild AKI with creatinine 1.1 otherwise electrolytes are fairly preserved received resuscitative fluids pulmonary consultation for bronchoscopic airway inspection and microbiology with bronchoalveolar lavage.  06/27/21- met with patient explained planned procedure , he consents to endotracheal intubation and bronchoscopy with BAL and planned airway inspection with therapeutic aspiration of tracheobronchial tree. Patient is agreeable.  Reviewed risks/complications and benefits with patient, risks include infection, pneumothorax/pneumomediastinum which may require chest tube placement as well as overnight/prolonged hospitalization and possible mechanical ventilation. Other risks include bleeding and very rarely death.  Patient understands risks and wishes to proceed.  Additional questions were answered, and patient is aware that post procedure patient will be going home  with family and may experience cough with possible clots on expectoration as well as phlegm which may last few days as well as hoarseness of voice post intubation and mechanical ventilation.  06/28/2021-patient seen and evaluated at bedside.  Remains on room air with no shortness of breath and normoxia.  He did have a fever overnight however this has resolved and he feels asymptomatic.  Microbiology from bronchoalveolar lavage is still in process.  Patient is stable and may possibly have outpatient therapy if he continues to improve.  06/29/21- patient is stable , he is nonfebrile. He had normal well formed BM.  He is urinating well and he is eating well. He is cleared for dc home from pulmonary perspective.  Thus far nothing has grown from BAL microbiology.   06/30/21- patient with febrile episode overnight.  Remains stable though.  Microbiology unrevealing thus far. CMV pcr as below. His HIV meds are being held for now PJP workup in process. Renal function stable. CBC with mild leukocytosis and anemia.  He vomited this morning states stomach was feeling sick after meal.  He has dyspnea with mild exertion and tachycardia. Since his renal function is good and we still do not have a definitive diagnosis to explain why he is tachycaric and dyspneic on mild exertion , I will obtain CT angio PE protocol   0 Result Notes     Component Ref Range & Units 3 d ago  CMV DNA Quant Negative IU/mL 498   Comment: The quantitative range of this assay is 200 to 1 million IU/mL.  Log10 CMV Qn DNA Pl log10 IU/mL 2.697   Comment: (NOTE)  Performed At: [facility]  [place], [place] [id number]         PAST MEDICAL HISTORY   Past Medical History:  Diagnosis Date   HIV (human immunodeficiency virus infection) ([facility])      SURGICAL HISTORY   History reviewed. No pertinent surgical history.   FAMILY HISTORY   History reviewed. No pertinent family history.   SOCIAL HISTORY   Social  History   Tobacco Use   Smoking status: Former    Types: Cigarettes    Passive exposure: Past   Smokeless tobacco: Never  Vaping Use   Vaping Use: Never used  Substance Use Topics   Alcohol use: Yes    Comment: daily   Drug use: Not Currently     MEDICATIONS    Home Medication:     Current Medication:  Current Facility-Administered Medications:    0.9 %  sodium chloride infusion, 250 mL, Intravenous, PRN, Pokhrel, Laxman, MD, Stopped at 06/27/21 1800   0.9 %  sodium chloride infusion, 250 mL, Intravenous, Continuous, Ottie Glazier, MD, Stopped at 06/29/21 0516   acetaminophen (TYLENOL) tablet 650 mg, 650 mg, Oral, Q6H PRN, 650 mg at 06/29/21 2037 **OR** acetaminophen (TYLENOL) suppository 650 mg, 650 mg, Rectal, Q6H PRN, Pokhrel, Laxman, MD   albuterol (PROVENTIL) (2.5 MG/3ML) 0.083% nebulizer solution 2.5 mg, 2.5 mg, Nebulization, Q4H PRN, Pokhrel, Laxman, MD   bictegravir-emtricitabine-tenofovir AF (BIKTARVY) 50-200-25 MG per tablet 1 tablet, 1 tablet, Oral, Daily, Ravishankar, Joellyn Quails, MD, 1 tablet at 07/02/21 0921   enoxaparin (LOVENOX) injection 40 mg, 40 mg, Subcutaneous, Q24H, Pokhrel, Laxman, MD, 40 mg at 07/01/21 2105   fentaNYL 2542mg in NS 2586m(1038mml) infusion-PREMIX, 0-200 mcg/hr, Intravenous, Continuous, AleOttie GlazierD, Stopped at 06/27/21 1254   multivitamin with minerals tablet 1 tablet, 1 tablet, Oral, Daily, AmiLorella NimrodD, 1 tablet at 07/01/21 2105   ondansetron (ZOFRAN) tablet 4 mg, 4 mg, Oral, Q6H PRN **OR** ondansetron (ZOFRAN) injection 4 mg, 4 mg, Intravenous, Q6H PRN, Pokhrel, Laxman, MD, 4 mg at 06/30/21 0734   pantoprazole (PROTONIX) EC tablet 40 mg, 40 mg, Oral, Daily, Pokhrel, Laxman, MD, 40 mg at 07/02/21 0921   polyethylene glycol (MIRALAX / GLYCOLAX) packet 17 g, 17 g, Oral, Daily, AmiLorella NimrodD, 17 g at 07/02/21 1155   predniSONE (DELTASONE) tablet 40 mg, 40 mg, Oral, BID WC, 40 mg at 07/02/21 0921 **FOLLOWED BY** [START ON  07/05/2021] predniSONE (DELTASONE) tablet 40 mg, 40 mg, Oral, Q breakfast, Ravishankar, Jayashree, MD   senna-docusate (Senokot-S) tablet 1 tablet, 1 tablet, Oral, QHS PRN, Pokhrel, Laxman, MD   sodium chloride flush (NS) 0.9 % injection 3 mL, 3 mL, Intravenous, Q12H, Pokhrel, Laxman, MD, 3 mL at 07/02/21 0923   sodium chloride flush (NS) 0.9 % injection 3 mL, 3 mL, Intravenous, PRN, Pokhrel, Laxman, MD   sulfamethoxazole-trimethoprim (BACTRIM) 320 mg in dextrose 5 % 500 mL IVPB, 320 mg, Intravenous, Q8H, Zeigler, DusSandi MealyPH, Last Rate: 346.7 mL/hr at 07/02/21 1519, Infusion Verify at 07/02/21 1519    ALLERGIES   Patient has no known allergies.     REVIEW OF SYSTEMS    Review of Systems:  Gen:  Denies  fever, sweats, chills weigh loss  HEENT: Denies blurred vision, double vision, ear pain, eye pain, hearing loss, nose bleeds, sore throat Cardiac:  No dizziness, chest pain or heaviness, chest tightness,edema Resp:   reports dyspnea chronically  Gi: Denies swallowing difficulty, stomach pain, nausea or vomiting, diarrhea, constipation, bowel incontinence Gu:  Denies bladder incontinence, burning urine  Ext:   Denies Joint pain, stiffness or swelling Skin: Denies  skin rash, easy bruising or bleeding or hives Endoc:  Denies polyuria, polydipsia , polyphagia or weight change Psych:   Denies depression, insomnia or hallucinations   Other:  All other systems negative   VS: BP 126/81   Pulse 77   Temp (!) 97.1 F (36.2 C)   Resp 16   Ht _0  (1.676 m)   Wt 51.4 kg   SpO2 97%   BMI 18.29 kg/m      PHYSICAL EXAM    GENERAL:NAD, no fevers, chills, no weakness no fatigue HEAD: Normocephalic, atraumatic.  EYES: Pupils equal, round, reactive to light. Extraocular muscles intact. No scleral icterus.  MOUTH: Moist mucosal membrane. Dentition intact. No abscess noted.  EAR, NOSE, THROAT: Clear without exudates. No external lesions.  NECK: Supple. No thyromegaly. No nodules.  No JVD.  PULMONARY: decreased breath sounds with mild rhonchi worse at bases bilaterally.  CARDIOVASCULAR: S1 and S2. Regular rate and rhythm. No murmurs, rubs, or gallops. No edema. Pedal pulses 2+ bilaterally.  GASTROINTESTINAL: Soft, nontender, nondistended. No masses. Positive bowel sounds. No hepatosplenomegaly.  MUSCULOSKELETAL: No swelling, clubbing, or edema. Range of motion full in all extremities.  NEUROLOGIC: Cranial nerves II through XII are intact. No gross focal neurological deficits. Sensation intact. Reflexes intact.  SKIN: No ulceration, lesions, rashes, or cyanosis. Skin warm and dry. Turgor intact.  PSYCHIATRIC: Mood, affect within normal limits. The patient is awake, alert and oriented x 3. Insight, judgment intact.       IMAGING     ASSESSMENT/PLAN   Acute hypoxemic respiratory failure- - due to bilateral pneumonia Patient with active AIDS and is under the care of infectious disease team in process of receiving therapy -Status post ID evaluation-appreciate input -There is recommendation for bronchoscopic evaluation with microbiology to be sent for KS/PJP/CMV -Patient reports some improvement since hospitalization and having resuscitative fluids #Patient is currently normoxic on room air and reports being close to baseline    Centrilobular emphysema -Active tobacco and THC abuse.  Smoking cessation counseling provided      AIDS CD4 less than 50 -Infectious disease on case-appreciate input    Thank you for allowing me to participate in the care of this patient.   Patient/Family are satisfied with care plan and all questions have been answered.    Provider disclosure: Patient with at least one acute or chronic illness or injury that poses a threat to life or bodily function and is being managed actively during this encounter.  All of the below services have been performed independently by signing provider:  review of prior documentation from internal and  or external health records.  Review of previous and current lab results.  Interview and comprehensive assessment during patient visit today. Review of current and previous chest radiographs/CT scans. Discussion of management and test interpretation with health care team and patient/family.   This document was prepared using Dragon voice recognition software and may include unintentional dictation errors.     Ottie Glazier, M.D.  Division of Pulmonary & Critical Care Medicine

## 2021-07-02 NOTE — Assessment & Plan Note (Addendum)
Concern of atypical pneumonia possibly PGP pneumonia, with recent diagnosis of ARDS.  No hypoxia.  COVID-19 and influenza PCR negative.  Chest x-ray with bilateral groundglass opacities.  Respiratory viral panel negative.  Blood cultures done twice remain negative. , toxo and cryptococcal labs are negative.  Fungal cultures negative.  Continue to have fever. S/p bronchoscopy and preliminary BAL cultures are negative. CMV quantitative assay and PCP came back positive, -Continue with IV Bactrim

## 2021-07-02 NOTE — Progress Notes (Signed)
Progress Note   Patient: Kent Page KMM:[id number] DOB: 04-08-1990 DOA: 06/26/2021     6 DOS: the patient was seen and examined on 07/02/2021   Brief hospital course: Patient is a 31 years old male with past medical history of recently diagnosed HIV was sent from ID clinic with concerns for increasing shortness of breath with hypoxia noted in the clinic with pulse ox in the 80s is in room air.  Patient was newly diagnosed of HIV/AIDs recently with a CD4 count of 29.  He was in the process of setting up treatment plan with ID   There was some concern for PJP pneumonia and the patient was sent to the hospital.  Patient states that he has been having dyspnea and shortness of breath since April which has been gradually worsening at this time.  He does have dyspnea on exertion and has mostly cough which is dry in nature with some productive cough in the morning.  Denies any fever,chills or rigor.  Denies any chest pain.  No hemoptysis.  Denies any sick contacts.  Does have a diminished appetite and has lost around 20 pounds of weight loss since April.  Denies any nausea vomiting or diarrhea.  Denies any urinary urgency frequency or dysuria.  He states that he has been smoking marijuana but denies smoking cigarettes recently.  Used to drink alcohol but has quit since March 2023.  In the ED, patient was mildly hypotensive with blood pressure of 96/77.  Pulse oximetry was however 95% on room air .  Repeated test showed WBC at 9.4 with hemoglobin of 12.2.  COVID and influenza was negative.  Lactic acid was elevated at 2.4 sodium of 135 with potassium of 3.9.  Creatinine was 1.1.  LFTs were within normal limits.  Patient received 1 L of IV fluid bolus in the ED.  Pulmonary was consulted from the ED for bronchoscopy.  Patient was then considered for admission to hospital for further evaluation and treatment.  5/17: Patient was waiting for bronchoscopy when seen today.  He was very concerned about not starting  HIV treatment early.  5/18: Patient had successful bronchoscopy and BAL was done, preliminary cultures are negative so far.  Patient developed fever at 101.3 this morning requiring Tylenol. Repeat blood cultures were done. Repeat chest x-ray with little worsening of right-sided infiltrates Per ID we will continue with Bactrim at this time and follow the cultures.  5/19: Patient remained afebrile after having fever yesterday morning.  Repeat blood cultures are negative so far.  BAL cultures are negative so far.  Fungal cultures are pending. Giving another bolus for softer blood pressure.  5/20: Patient developed fever up to 101.3 last night around 8 PM.  Remained febrile this morning at 100.2.  HAART got delayed till Sunday as he needs to be on prednisone for at least 48 hours before starting antiviral medications for HIV to prevent immune reconstitution syndrome.  He was started on prednisone yesterday.  CBC with mild leukocytosis now, hemoglobin down at 10.7 with stable thrombocytosis at 572.  Blood cultures remain negative.  BAL culture remain negative.  Fungal cultures negative.  pneumocystic, toxo, RPR, hepatitis panel, cryptococcal labs are negative.  5/21: Afebrile this morning, last recorded fever of 100.2 yesterday around 8 PM.  Pulmonary did CTA to rule out PE due to tachycardia and tachypnea with minor exertion.  It was negative for PE and did show diffuse bilateral groundglass opacities which may represent edema, pneumonia or ARDS.  Patient is  being started on HAART with Biktarvy today.  5/22: Patient was started on [place] yesterday.  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    Assessment and Plan: * Pneumonia Concern of atypical pneumonia possibly PGP pneumonia, with recent diagnosis of ARDS.  No hypoxia.  COVID-19 and influenza PCR negative.  Chest x-ray with bilateral groundglass opacities.  Respiratory viral panel negative.  Blood cultures done twice remain negative. ,  toxo and cryptococcal labs are negative.  Fungal cultures negative.  Continue to have fever. S/p bronchoscopy and preliminary BAL cultures are negative. CMV quantitative assay and PCP came back positive, -Continue with IV Bactrim   AIDS ([place]) New diagnosis of AIDS.Absolute CD4 count of 28 with significantly elevated viral load at 421 K. from blood work 06/19/2021.  ID Dr. Delaine Lame on board.  Chlamydia and gonorrhea are negative.  RPR negative, cryptococcal antigen negative.  Hepatitis panel negative.  Patient received prednisone past 2 days -HAART treatment with [name] was started today. -Continue with prednisone at this time  SOB (shortness of breath) Most likely secondary to pneumonia.  No hypoxia.  CTA was done for concern of tachypnea and tachycardia with minor exertion and it was negative for PE but did show bilateral diffuse opacities which can represent edema, pneumonia or ARDS. -Continue to monitor -Treat as above  Lactate blood increased Resolved.  Protein-calorie malnutrition, severe ([place]) Estimated body mass index is 19.14 kg/m as calculated from the following:   Height as of this encounter: _0  (1.676 m).   Weight as of this encounter: 53.8 kg.   -Dietitian consult     Subjective: Patient was seen and examined today.  He seems improving, less shortness of breath with walking.  Physical Exam: Vitals:   07/02/21 0419 07/02/21 0500 07/02/21 0918 07/02/21 1516  BP: 104/84  112/73 126/81  Pulse: 78  95 77  Resp: _1 Temp: 97.9 F (36.6 C)  98.1 F (36.7 C) (!) 97.1 F (36.2 C)  TempSrc: Oral  Oral   SpO2: 98%  97% 97%  Weight:  51.4 kg    Height:       General.  Malnourished gentleman, in no acute distress. Pulmonary.  Lungs clear bilaterally, normal respiratory effort. CV.  Regular rate and rhythm, no JVD, rub or murmur. Abdomen.  Soft, nontender, nondistended, BS positive. CNS.  Alert and oriented .  No focal neurologic deficit. Extremities.   No edema, no cyanosis, pulses intact and symmetrical. Psychiatry.  Judgment and insight appears normal.  Data Reviewed: Prior notes and labs reviewed  Family Communication:   Disposition: Status is: Inpatient Remains inpatient appropriate because: Severity of illness   Planned Discharge Destination: Home  DVT prophylaxis.  Lovenox Time spent: 45 minutes  This record has been created using Systems analyst. Errors have been sought and corrected,but may not always be located. Such creation errors do not reflect on the standard of care.  Author: Lorella Nimrod, MD 07/02/2021 4:33 PM  For on call review [email or web address].

## 2021-07-02 NOTE — Progress Notes (Signed)
Patient hasn't had a BM since 06/26/21. Order received from Dr Nelson Chimes for miralax and d/c telemetry

## 2021-07-02 NOTE — Progress Notes (Signed)
Date of Admission:  06/26/2021      ID: Kent Page is a 31 y.o. male Principal Problem:   Pneumonia Active Problems:   AIDS ([place])   SOB (shortness of breath)   Lactate blood increased   Protein-calorie malnutrition, severe ([facility])    Subjective: Pt feeling better Appetite has improved significantly. Has more energy Less short of breath  Medications:   bictegravir-emtricitabine-tenofovir AF  1 tablet Oral Daily   enoxaparin (LOVENOX) injection  40 mg Subcutaneous Q24H   multivitamin with minerals  1 tablet Oral Daily   pantoprazole  40 mg Oral Daily   predniSONE  40 mg Oral BID WC   Followed by   Derrill Memo ON 07/04/2021] predniSONE  40 mg Oral Q breakfast   sodium chloride flush  3 mL Intravenous Q12H    Objective: Vital signs in last 24 hours: Temp:  [97.3 F (36.3 C)-98.1 F (36.7 C)] 98.1 F (36.7 C) (05/22 0918) Pulse Rate:  [78-97] 95 (05/22 0918) Resp:  [16-17] 16 (05/22 0918) BP: (100-112)/(71-85) 112/73 (05/22 0918) SpO2:  [96 %-98 %] 97 % (05/22 0918) Weight:  [51.4 kg] 51.4 kg (05/22 0500)   PHYSICAL EXAM:  General: Alert, cooperative, no distress, appears stated age. emaciated Head: Normocephalic, without obvious abnormality, atraumatic. Eyes: Conjunctivae clear, anicteric sclerae. Pupils are equal ENT Nares normal. No drainage or sinus tenderness. Tongue coated Neck: Supple, symmetrical, no adenopathy, thyroid: non tender no carotid bruit and no JVD. Back: No CVA tenderness. Lungs:b/l air entry B/l crepitations in the bases Heart: Tachycardia At rest it was 107 and on walking it increased to 137. Abdomen: Soft, non-tender,not distended. Bowel sounds normal. No masses Extremities: atraumatic, no cyanosis. No edema. No clubbing Skin: No rashes or lesions. Or bruising Lymph: Cervical, supraclavicular normal. Neurologic: Grossly non-focal  Lab Results Recent Labs    06/29/21 1353 06/30/21 0459 07/02/21 0533  WBC  --  11.0*  --   HGB  --   10.7*  --   HCT  --  32.0*  --   NA 134*  --  133*  K 4.6 4.4 4.7  CL 106  --  103  CO2 21*  --  19*  BUN 8  --  13  CREATININE 1.22 1.12 1.04    Microbiology: BAL cell count 17 ( 53% M, 145 L BC NG BAL: Pneumocystis PCR positive BAL CMV DNA 498 Serum: CMV PCR 1080  Studies/Results: CT Angio Chest Pulmonary Embolism (PE) W or WO Contrast  Result Date: 06/30/2021 CLINICAL DATA:  Concern for pulmonary embolism. EXAM: CT ANGIOGRAPHY CHEST WITH CONTRAST TECHNIQUE: Multidetector CT imaging of the chest was performed using the standard protocol during bolus administration of intravenous contrast. Multiplanar CT image reconstructions and MIPs were obtained to evaluate the vascular anatomy. RADIATION DOSE REDUCTION: This exam was performed according to the departmental dose-optimization program which includes automated exposure control, adjustment of the mA and/or kV according to patient size and/or use of iterative reconstruction technique. CONTRAST:  32m OMNIPAQUE IOHEXOL 350 MG/ML SOLN COMPARISON:  Chest radiograph dated 06/28/2021. FINDINGS: Evaluation of this exam is limited due to respiratory motion artifact. Cardiovascular: There is no cardiomegaly or pericardial effusion. The thoracic aorta is unremarkable. Evaluation of the pulmonary arteries is limited due to respiratory motion artifact. No pulmonary artery embolus identified. Mediastinum/Nodes: No hilar or mediastinal adenopathy. The esophagus and the thyroid gland are grossly unremarkable. No mediastinal fluid collection. Lungs/Pleura: Diffuse bilateral ground-glass airspace opacities may represent edema, pneumonia, or ARDS. Clinical correlation is  recommended. No pleural effusion or pneumothorax. The central airways are patent. Upper Abdomen: No acute abnormality. Musculoskeletal: No chest wall abnormality. No acute or significant osseous findings. Review of the MIP images confirms the above findings. IMPRESSION: 1. No CT evidence of  pulmonary embolism. 2. Diffuse bilateral ground-glass airspace opacities may represent edema, pneumonia, or ARDS. Electronically Signed   By: Anner Crete M.D.   On: 06/30/2021 21:24     Assessment/Plan: 31 yr male presenting with sob, fatigue and weight loss of more than 3 weeks Diagnosed HIV on 06/19/21, Cd4 is 28 and Vl 421K Pt has advanced aids with progressive dyspnea  with b/l GGO in CXR with Hypoxia with desaturation on exercise ABG shows hypoxia  PJP PCR positive in BAL Also has low level CMV Count in BAL and blood The lower level count in BAL and blood is not significant but we need to keep a close eye especially patient being on prednisone. No treatment needed for the CMP and BAL or blood.  We will change Bactrim IV to p.o. starting tomorrow.  We will reduce prednisone to 40 mg daily for 5 days followed by 20 mg daily for 11 days.  fungitell ( beta D glucan)-result pending Toxo, crypto, hepatitis profile-neg RESp panel PCR-neg     Weight loss due to AIDS   Hypoalbuminemia due to AIDS   Discussed the management with the patient and hospitalist.

## 2021-07-02 NOTE — Progress Notes (Signed)
Mobility Specialist - Progress Note   07/02/21 1600  Mobility  Activity Ambulated independently in hallway  Level of Assistance Independent  Assistive Device None  Distance Ambulated (ft) 160 ft  Activity Response Tolerated well  $Mobility charge 1 Mobility     Pre-mobility: 101 HR, 97% SpO2 During mobility: 134 HR, 92% SpO2 Post-mobility: 100 HR, 93% SPO2   Pt lying in bed upon arrival, utilizing RA. Pt ambulated in hallway independently. O2 sats maintained low-mid 90s with ambulation, however pt still very noticeably SOB and winded. HR increased to mid 130s during activity. Pt returned supine with quick recovery and HR returning to low 100s. Needs in reach, RN notified.    Kathee Delton Mobility Specialist 07/02/21, 4:42 PM

## 2021-07-03 ENCOUNTER — Other Ambulatory Visit (HOSPITAL_COMMUNITY): Payer: Self-pay

## 2021-07-03 ENCOUNTER — Other Ambulatory Visit: Payer: Self-pay

## 2021-07-03 DIAGNOSIS — B20 Human immunodeficiency virus [HIV] disease: Secondary | ICD-10-CM | POA: Diagnosis not present

## 2021-07-03 DIAGNOSIS — B259 Cytomegaloviral disease, unspecified: Secondary | ICD-10-CM

## 2021-07-03 DIAGNOSIS — B59 Pneumocystosis: Secondary | ICD-10-CM | POA: Diagnosis not present

## 2021-07-03 LAB — CBC WITH DIFFERENTIAL/PLATELET
Abs Immature Granulocytes: 0.19 10*3/uL — ABNORMAL HIGH (ref 0.00–0.07)
Basophils Absolute: 0 10*3/uL (ref 0.0–0.1)
Basophils Relative: 0 %
Eosinophils Absolute: 0 10*3/uL (ref 0.0–0.5)
Eosinophils Relative: 0 %
HCT: 36.6 % — ABNORMAL LOW (ref 39.0–52.0)
Hemoglobin: 12.2 g/dL — ABNORMAL LOW (ref 13.0–17.0)
Immature Granulocytes: 1 %
Lymphocytes Relative: 8 %
Lymphs Abs: 1.1 10*3/uL (ref 0.7–4.0)
MCH: 28.2 pg (ref 26.0–34.0)
MCHC: 33.3 g/dL (ref 30.0–36.0)
MCV: 84.5 fL (ref 80.0–100.0)
Monocytes Absolute: 1.2 10*3/uL — ABNORMAL HIGH (ref 0.1–1.0)
Monocytes Relative: 9 %
Neutro Abs: 10.9 10*3/uL — ABNORMAL HIGH (ref 1.7–7.7)
Neutrophils Relative %: 82 %
Platelets: 772 10*3/uL — ABNORMAL HIGH (ref 150–400)
RBC: 4.33 MIL/uL (ref 4.22–5.81)
RDW: 12.7 % (ref 11.5–15.5)
WBC: 13.4 10*3/uL — ABNORMAL HIGH (ref 4.0–10.5)
nRBC: 0 % (ref 0.0–0.2)

## 2021-07-03 LAB — BASIC METABOLIC PANEL
Anion gap: 10 (ref 5–15)
BUN: 12 mg/dL (ref 6–20)
CO2: 22 mmol/L (ref 22–32)
Calcium: 8.7 mg/dL — ABNORMAL LOW (ref 8.9–10.3)
Chloride: 100 mmol/L (ref 98–111)
Creatinine, Ser: 1.1 mg/dL (ref 0.61–1.24)
GFR, Estimated: >60 mL/min (ref >60)
Glucose, Bld: 92 mg/dL (ref 70–99)
Potassium: 4.4 mmol/L (ref 3.5–5.1)
Sodium: 132 mmol/L — ABNORMAL LOW (ref 135–145)

## 2021-07-03 LAB — CREATININE, SERUM
Creatinine, Ser: 1.03 mg/dL (ref 0.61–1.24)
GFR, Estimated: >60 mL/min (ref >60)

## 2021-07-03 LAB — CULTURE, BLOOD (ROUTINE X 2)
Culture: NO GROWTH
Culture: NO GROWTH
Special Requests: ADEQUATE
Special Requests: ADEQUATE

## 2021-07-03 LAB — POTASSIUM: Potassium: 5.7 mmol/L — ABNORMAL HIGH (ref 3.5–5.1)

## 2021-07-03 MED ORDER — SULFAMETHOXAZOLE-TRIMETHOPRIM 800-160 MG PO TABS
2.0000 | ORAL_TABLET | Freq: Three times a day (TID) | ORAL | Status: DC
  Administered 2021-07-03 – 2021-07-04 (×3): 2 via ORAL
  Filled 2021-07-03 (×3): qty 2

## 2021-07-03 NOTE — Progress Notes (Signed)
Progress Note   Patient: Kent Page FXT:[id number] DOB: 11-Apr-1990 DOA: 06/26/2021     7 DOS: the patient was seen and examined on 07/03/2021   Brief hospital course: Patient is a 31 years old male with past medical history of recently diagnosed HIV was sent from ID clinic with concerns for increasing shortness of breath with hypoxia noted in the clinic with pulse ox in the 80s is in room air.  Patient was newly diagnosed of HIV/AIDs recently with a CD4 count of 29.  He was in the process of setting up treatment plan with ID   There was some concern for PJP pneumonia and the patient was sent to the hospital.  Patient states that he has been having dyspnea and shortness of breath since April which has been gradually worsening at this time.  He does have dyspnea on exertion and has mostly cough which is dry in nature with some productive cough in the morning.  Denies any fever,chills or rigor.  Denies any chest pain.  No hemoptysis.  Denies any sick contacts.  Does have a diminished appetite and has lost around 20 pounds of weight loss since April.  Denies any nausea vomiting or diarrhea.  Denies any urinary urgency frequency or dysuria.  He states that he has been smoking marijuana but denies smoking cigarettes recently.  Used to drink alcohol but has quit since March 2023.  In the ED, patient was mildly hypotensive with blood pressure of 96/77.  Pulse oximetry was however 95% on room air .  Repeated test showed WBC at 9.4 with hemoglobin of 12.2.  COVID and influenza was negative.  Lactic acid was elevated at 2.4 sodium of 135 with potassium of 3.9.  Creatinine was 1.1.  LFTs were within normal limits.  Patient received 1 L of IV fluid bolus in the ED.  Pulmonary was consulted from the ED for bronchoscopy.  Patient was then considered for admission to hospital for further evaluation and treatment.  5/17: Patient was waiting for bronchoscopy when seen today.  He was very concerned about not starting  HIV treatment early.  5/18: Patient had successful bronchoscopy and BAL was done, preliminary cultures are negative so far.  Patient developed fever at 101.3 this morning requiring Tylenol. Repeat blood cultures were done. Repeat chest x-ray with little worsening of right-sided infiltrates Per ID we will continue with Bactrim at this time and follow the cultures.  5/19: Patient remained afebrile after having fever yesterday morning.  Repeat blood cultures are negative so far.  BAL cultures are negative so far.  Fungal cultures are pending. Giving another bolus for softer blood pressure.  5/20: Patient developed fever up to 101.3 last night around 8 PM.  Remained febrile this morning at 100.2.  HAART got delayed till Sunday as he needs to be on prednisone for at least 48 hours before starting antiviral medications for HIV to prevent immune reconstitution syndrome.  He was started on prednisone yesterday.  CBC with mild leukocytosis now, hemoglobin down at 10.7 with stable thrombocytosis at 572.  Blood cultures remain negative.  BAL culture remain negative.  Fungal cultures negative.  pneumocystic, toxo, RPR, hepatitis panel, cryptococcal labs are negative.  5/21: Afebrile this morning, last recorded fever of 100.2 yesterday around 8 PM.  Pulmonary did CTA to rule out PE due to tachycardia and tachypnea with minor exertion.  It was negative for PE and did show diffuse bilateral groundglass opacities which may represent edema, pneumonia or ARDS.  Patient is  being started on HAART with Biktarvy today.  5/22: Patient was started on [place] yesterday.  CMV positive in BAL and bowel blood counts are low per ID.  No specific treatment needed at this time.  5/23: Patient is tolerating Biktarvy.  Potassium elevated at 5.7 initially thought to be due to Bactrim but repeat was within normal limit at 4.4, ID is planning to switch IV Bactrim to p.o. today, they are recommending prednisone for 50 mg for next 5  days followed by 20 mg for the 11 more days. Her ID if remains stable will be ready for discharge tomorrow.   Assessment and Plan: * Pneumonia Concern of atypical pneumonia possibly PGP pneumonia, with recent diagnosis of ARDS.  No hypoxia.  COVID-19 and influenza PCR negative.  Chest x-ray with bilateral groundglass opacities.  Respiratory viral panel negative.  Blood cultures done twice remain negative. , toxo and cryptococcal labs are negative.  Fungal cultures negative.  Continue to have fever. S/p bronchoscopy and BAL culture was positive for PJP CMV quantitative assay and PJP came back positive, -IV Bactrim was switched to p.o.  AIDS [facility]) New diagnosis of AIDS.Absolute CD4 count of 28 with significantly elevated viral load at 421 K. from blood work 06/19/2021.  ID Dr. Delaine Lame on board.  Chlamydia and gonorrhea are negative.  RPR negative, cryptococcal antigen negative.  Hepatitis panel negative.  Patient received prednisone past 2 days -HAART treatment with [name] was started on Sunday -Continue with prednisone at this time  SOB (shortness of breath) Most likely secondary to pneumonia.  No hypoxia.  CTA was done for concern of tachypnea and tachycardia with minor exertion and it was negative for PE but did show bilateral diffuse opacities which can represent edema, pneumonia or ARDS. -Continue to monitor -Treat as above  Lactate blood increased Resolved.  Protein-calorie malnutrition, severe ([place]) Estimated body mass index is 19.14 kg/m as calculated from the following:   Height as of this encounter: _0  (1.676 m).   Weight as of this encounter: 53.8 kg.   -Dietitian consult     Subjective: Patient was seen and examined today.  Continued to improve.  Still some tachypnea and tachycardia with minor exertion but stating that he was able to tolerate some walk now.  Physical Exam: Vitals:   07/02/21 2020 07/03/21 0428 07/03/21 0500 07/03/21 1453  BP: 110/85 (!) 120/91   106/75  Pulse: 81 77  86  Resp: _1 Temp: 98.3 F (36.8 C) 97.8 F (36.6 C)  97.9 F (36.6 C)  TempSrc: Oral Oral    SpO2: 99% 99%  99%  Weight:   52.2 kg   Height:       General.  Malnourished gentleman, in no acute distress. Pulmonary.  Lungs clear bilaterally, normal respiratory effort. CV.  Regular rate and rhythm, no JVD, rub or murmur. Abdomen.  Soft, nontender, nondistended, BS positive. CNS.  Alert and oriented .  No focal neurologic deficit. Extremities.  No edema, no cyanosis, pulses intact and symmetrical. Psychiatry.  Judgment and insight appears normal.  Data Reviewed: Prior notes, labs reviewed.  Family Communication: Discussed with patient, patient wants to discuss with his family himself.  Disposition: Status is: Inpatient Remains inpatient appropriate because: Severity of illness   Planned Discharge Destination: Home  DVT prophylaxis.  Lovenox Time spent: 40 minutes  This record has been created using Systems analyst. Errors have been sought and corrected,but may not always be located. Such creation errors do  not reflect on the standard of care.  Author: Lorella Nimrod, MD 07/03/2021 4:24 PM  For on call review [email or web address].

## 2021-07-03 NOTE — Assessment & Plan Note (Signed)
New diagnosis of AIDS.Absolute CD4 count of 28 with significantly elevated viral load at 421 K. from blood work 06/19/2021.  ID Dr. Rivka Safer on board.  Chlamydia and gonorrhea are negative.  RPR negative, cryptococcal antigen negative.  Hepatitis panel negative.  Patient received prednisone past 2 days -HAART treatment with Susanne Borders was started on Sunday -Continue with prednisone at this time

## 2021-07-03 NOTE — Assessment & Plan Note (Addendum)
Concern of atypical pneumonia possibly PGP pneumonia, with recent diagnosis of ARDS.  No hypoxia.  COVID-19 and influenza PCR negative.  Chest x-ray with bilateral groundglass opacities.  Respiratory viral panel negative.  Blood cultures done twice remain negative. , toxo and cryptococcal labs are negative.  Fungal cultures negative.  Continue to have fever. S/p bronchoscopy and BAL culture was positive for PJP CMV quantitative assay and PJP came back positive, -IV Bactrim was switched to p.o.

## 2021-07-03 NOTE — Progress Notes (Signed)
PULMONOLOGY         Date: 07/03/2021,   MRN# [id number] Kent Page [birth year]     AdmissionWeight: 54.4 kg                 CurrentWeight: 52.2 kg  Referring provider: Dr. 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  Recent CD4 count of 29.  Was setting up plan with infectious disease for AIDS therapy.  Had concerns for PJP pneumonia and was sent to the hospital to evaluate for this.  Reports ongoing progressive shortness of breath from April denies sick contacts does admit to productive cough denies fevers chills hemoptysis has been anorexic with over 20 pound weight loss in the last few months.  Generally smokes marijuana and cigarettes daily does use alcohol but declines if he is with alcoholism has not had seizures in the past.  In the ED was noted to be hypotensive tachycardic with hypoxemia on mild exertion mild AKI with creatinine 1.1 otherwise electrolytes are fairly preserved received resuscitative fluids pulmonary consultation for bronchoscopic airway inspection and microbiology with bronchoalveolar lavage.  06/27/21- met with patient explained planned procedure , he consents to endotracheal intubation and bronchoscopy with BAL and planned airway inspection with therapeutic aspiration of tracheobronchial tree. Patient is agreeable.  Reviewed risks/complications and benefits with patient, risks include infection, pneumothorax/pneumomediastinum which may require chest tube placement as well as overnight/prolonged hospitalization and possible mechanical ventilation. Other risks include bleeding and very rarely death.  Patient understands risks and wishes to proceed.  Additional questions were answered, and patient is aware that post procedure patient will be going home  with family and may experience cough with possible clots on expectoration as well as phlegm which may last few days as well as hoarseness of voice post intubation and mechanical ventilation.  06/28/2021-patient seen and evaluated at bedside.  Remains on room air with no shortness of breath and normoxia.  He did have a fever overnight however this has resolved and he feels asymptomatic.  Microbiology from bronchoalveolar lavage is still in process.  Patient is stable and may possibly have outpatient therapy if he continues to improve.  06/29/21- patient is stable , he is nonfebrile. He had normal well formed BM.  He is urinating well and he is eating well. He is cleared for dc home from pulmonary perspective.  Thus far nothing has grown from BAL microbiology.   06/30/21- patient with febrile episode overnight.  Remains stable though.  Microbiology unrevealing thus far. CMV pcr as below. His HIV meds are being held for now PJP workup in process. Renal function stable. CBC with mild leukocytosis and anemia.  He vomited this morning states stomach was feeling sick after meal.  He has dyspnea with mild exertion and tachycardia. Since his renal function is good and we still do not have a definitive diagnosis to explain why he is tachycaric and dyspneic on mild exertion , I will obtain CT angio PE protocol   0 Result Notes     Component Ref Range & Units 3 d ago  CMV DNA Quant Negative IU/mL 498   Comment: The quantitative range of this assay is 200 to 1 million IU/mL.  Log10 CMV Qn DNA Pl log10 IU/mL 2.697   Comment: (NOTE)  Performed At: [facility]  [address] [place], [place] [id number]         07/02/21- Patient + for PJP.  We reviewed Chest CT images together because he had questions regarding severity and areas of lung affected.  He is on bactrim IV TID per infectious disease.  He is feeling ok at rest but dyspenic with minimal exertion.     07/03/21- patient is feeling better.  He  reports constipation and ongoing dyspnea with exertion when ambulating around hallway.  He is eating well and was able to wash himself but was feeling severe shortness of breath.  Overall he still reports interval improvement. He remain on HIV medications as well as PJP therapy with bactrim and prednisone.      PAST MEDICAL HISTORY   Past Medical History:  Diagnosis Date   HIV (human immunodeficiency virus infection) ([place])      SURGICAL HISTORY   History reviewed. No pertinent surgical history.   FAMILY HISTORY   History reviewed. No pertinent family history.   SOCIAL HISTORY   Social History   Tobacco Use   Smoking status: Former    Types: Cigarettes    Passive exposure: Past   Smokeless tobacco: Never  Vaping Use   Vaping Use: Never used  Substance Use Topics   Alcohol use: Yes    Comment: daily   Drug use: Not Currently     MEDICATIONS    Home Medication:     Current Medication:  Current Facility-Administered Medications:    0.9 %  sodium chloride infusion, 250 mL, Intravenous, PRN, Pokhrel, Laxman, MD, Stopped at 06/27/21 1800   0.9 %  sodium chloride infusion, 250 mL, Intravenous, Continuous, Ottie Glazier, MD, Stopped at 06/29/21 0516   acetaminophen (TYLENOL) tablet 650 mg, 650 mg, Oral, Q6H PRN, 650 mg at 06/29/21 2037 **OR** acetaminophen (TYLENOL) suppository 650 mg, 650 mg, Rectal, Q6H PRN, Pokhrel, Laxman, MD   albuterol (PROVENTIL) (2.5 MG/3ML) 0.083% nebulizer solution 2.5 mg, 2.5 mg, Nebulization, Q4H PRN, Pokhrel, Laxman, MD   bictegravir-emtricitabine-tenofovir AF (BIKTARVY) 50-200-25 MG per tablet 1 tablet, 1 tablet, Oral, Daily, Ravishankar, Joellyn Quails, MD, 1 tablet at 07/03/21 0926   enoxaparin (LOVENOX) injection 40 mg, 40 mg, Subcutaneous, Q24H, Pokhrel, Laxman, MD, 40 mg at 07/02/21 2133   multivitamin with minerals tablet 1 tablet, 1 tablet, Oral, Daily, Lorella Nimrod, MD, 1 tablet at 07/02/21 2128   ondansetron (ZOFRAN) tablet 4 mg, 4  mg, Oral, Q6H PRN **OR** ondansetron (ZOFRAN) injection 4 mg, 4 mg, Intravenous, Q6H PRN, Pokhrel, Laxman, MD, 4 mg at 06/30/21 0734   pantoprazole (PROTONIX) EC tablet 40 mg, 40 mg, Oral, Daily, Pokhrel, Laxman, MD, 40 mg at 07/03/21 0926   polyethylene glycol (MIRALAX / GLYCOLAX) packet 17 g, 17 g, Oral, Daily, Lorella Nimrod, MD, 17 g at 07/03/21 0927   [EXPIRED] predniSONE (DELTASONE) tablet 40 mg, 40 mg, Oral, BID WC, 40 mg at 07/02/21 1702 **FOLLOWED BY** predniSONE (DELTASONE) tablet 40 mg, 40 mg, Oral, Q breakfast, Ravishankar, Jayashree, MD, 40 mg at 07/03/21 0240   senna-docusate (Senokot-S) tablet 1 tablet, 1 tablet, Oral, QHS PRN, Pokhrel, Laxman, MD   sodium chloride flush (NS) 0.9 % injection 3 mL, 3 mL, Intravenous, Q12H, Pokhrel, Laxman, MD, 3 mL at 07/03/21 0933   sodium chloride flush (NS) 0.9 % injection 3 mL, 3 mL, Intravenous, PRN, Pokhrel, Laxman, MD   sulfamethoxazole-trimethoprim (BACTRIM) 320 mg in dextrose 5 % 500 mL IVPB, 320 mg, Intravenous, Q8H, Zeigler, Sandi Mealy, RPH, Last Rate: 346.7 mL/hr at 07/03/21  4496, 320 mg at 07/03/21 7591    ALLERGIES   Patient has no known allergies.     REVIEW OF SYSTEMS    Review of Systems:  Gen:  Denies  fever, sweats, chills weigh loss  HEENT: Denies blurred vision, double vision, ear pain, eye pain, hearing loss, nose bleeds, sore throat Cardiac:  No dizziness, chest pain or heaviness, chest tightness,edema Resp:   reports dyspnea chronically  Gi: Denies swallowing difficulty, stomach pain, nausea or vomiting, diarrhea, constipation, bowel incontinence Gu:  Denies bladder incontinence, burning urine Ext:   Denies Joint pain, stiffness or swelling Skin: Denies  skin rash, easy bruising or bleeding or hives Endoc:  Denies polyuria, polydipsia , polyphagia or weight change Psych:   Denies depression, insomnia or hallucinations   Other:  All other systems negative   VS: BP (!) 120/91 (BP Location: Right Arm)   Pulse 77    Temp 97.8 F (36.6 C) (Oral)   Resp 20   Ht _0  (1.676 m)   Wt 52.2 kg   SpO2 99%   BMI 18.57 kg/m      PHYSICAL EXAM    GENERAL:NAD, no fevers, chills, no weakness no fatigue HEAD: Normocephalic, atraumatic.  EYES: Pupils equal, round, reactive to light. Extraocular muscles intact. No scleral icterus.  MOUTH: Moist mucosal membrane. Dentition intact. No abscess noted.  EAR, NOSE, THROAT: Clear without exudates. No external lesions.  NECK: Supple. No thyromegaly. No nodules. No JVD.  PULMONARY: decreased breath sounds with mild rhonchi worse at bases bilaterally.  CARDIOVASCULAR: S1 and S2. Regular rate and rhythm. No murmurs, rubs, or gallops. No edema. Pedal pulses 2+ bilaterally.  GASTROINTESTINAL: Soft, nontender, nondistended. No masses. Positive bowel sounds. No hepatosplenomegaly.  MUSCULOSKELETAL: No swelling, clubbing, or edema. Range of motion full in all extremities.  NEUROLOGIC: Cranial nerves II through XII are intact. No gross focal neurological deficits. Sensation intact. Reflexes intact.  SKIN: No ulceration, lesions, rashes, or cyanosis. Skin warm and dry. Turgor intact.  PSYCHIATRIC: Mood, affect within normal limits. The patient is awake, alert and oriented x 3. Insight, judgment intact.       IMAGING     ASSESSMENT/PLAN   Acute hypoxemic respiratory failure- - due to bilateral pneumonia- due to PJP in context of AIDS Patient with active AIDS and is under the care of infectious disease team in process of receiving therapy -Status post ID evaluation-appreciate input -Treatment with bactrim tid  -Patient reports some improvement since hospitalization and having resuscitative fluids #Patient is currently normoxic on room air at rest but hypoxemic with activity     Centrilobular emphysema -Active tobacco and THC abuse.  Smoking cessation counseling provided      AIDS CD4 less than 50 -Infectious disease on case-appreciate input -on HIV  medication now   Thank you for allowing me to participate in the care of this patient.   Patient/Family are satisfied with care plan and all questions have been answered.    Provider disclosure: Patient with at least one acute or chronic illness or injury that poses a threat to life or bodily function and is being managed actively during this encounter.  All of the below services have been performed independently by signing provider:  review of prior documentation from internal and or external health records.  Review of previous and current lab results.  Interview and comprehensive assessment during patient visit today. Review of current and previous chest radiographs/CT scans. Discussion of management and test interpretation with health care team  and patient/family.   This document was prepared using Dragon voice recognition software and may include unintentional dictation errors.     Ottie Glazier, M.D.  Division of Pulmonary & Critical Care Medicine

## 2021-07-03 NOTE — Progress Notes (Signed)
Date of Admission:  06/26/2021      ID: Kia Stavros is a 31 y.o. male Principal Problem:   Pneumonia Active Problems:   AIDS ([place])   SOB (shortness of breath)   Lactate blood increased   Protein-calorie malnutrition, severe ([facility])    Subjective: Pt feeling better Appetite has improved significantly. Has more energy Less short of breath  Medications:   bictegravir-emtricitabine-tenofovir AF  1 tablet Oral Daily   enoxaparin (LOVENOX) injection  40 mg Subcutaneous Q24H   multivitamin with minerals  1 tablet Oral Daily   pantoprazole  40 mg Oral Daily   polyethylene glycol  17 g Oral Daily   predniSONE  40 mg Oral Q breakfast   sodium chloride flush  3 mL Intravenous Q12H   sulfamethoxazole-trimethoprim  2 tablet Oral Q8H    Objective: Vital signs in last 24 hours: Patient Vitals for the past 24 hrs:  BP Temp Temp src Pulse Resp SpO2 Weight  07/03/21 0500 -- -- -- -- -- -- 52.2 kg  07/03/21 0428 (!) 120/91 97.8 F (36.6 C) Oral 77 20 99 % --  07/02/21 2020 110/85 98.3 F (36.8 C) Oral 81 18 99 % --  07/02/21 1516 126/81 (!) 97.1 F (36.2 C) -- 77 16 97 % --    PHYSICAL EXAM:  General: Alert, cooperative, no distress, appears stated age. emaciated Head: Normocephalic, without obvious abnormality, atraumatic. Eyes: Conjunctivae clear, anicteric sclerae. Pupils are equal ENT Nares normal. No drainage or sinus tenderness.  Neck: Supple, symmetrical, no adenopathy, thyroid: non tender no carotid bruit and no JVD. Back: No CVA tenderness. Lungs:b/l air entry B/l crepitations in the bases Heart: Tachycardia  Abdomen: Soft, non-tender,not distended. Bowel sounds normal. No masses Extremities: atraumatic, no cyanosis. No edema. No clubbing Skin: No rashes or lesions. Or bruising Lymph: Cervical, supraclavicular normal. Neurologic: Grossly non-focal  Lab Results Recent Labs    07/02/21 0533 07/03/21 0416 07/03/21 0931  WBC  --   --  13.4*  HGB  --   --   12.2*  HCT  --   --  36.6*  NA 133*  --  132*  K 4.7 5.7* 4.4  CL 103  --  100  CO2 19*  --  22  BUN 13  --  12  CREATININE 1.04 1.03 1.10    Microbiology: BAL cell count 17 ( 53% M, 145 L BC NG BAL: Pneumocystis PCR positive BAL CMV DNA 498 Serum: CMV PCR 1080  Studies/Results: No results found.   Assessment/Plan: 31 yr male presenting with sob, fatigue and weight loss of more than 3 weeks Diagnosed HIV on 06/19/21, Cd4 is 28 and Vl 421K Pt has advanced aids with progressive dyspnea  with b/l GGO in CXR with Hypoxia with desaturation on exercise ABG shows hypoxia  PJP PCR positive in BAL Also has low level CMV Count in BAL and blood The lower level count in BAL and blood is not significant but we need to keep a close eye especially patient being on prednisone. No treatment needed for CMV currently  We will change Bactrim IV to p.o. starting today  We will reduce prednisone to 40 mg daily for 5 days followed by 20 mg daily for 11 days.  AIDS- started on Biktarvy on 5/21- pt was very insistent on starting HAART so started it a bit earlier --watch closely for immune reconstitution..   fungitell ( beta D glucan)-result pending Toxo, crypto, hepatitis profile-neg RESp panel PCR-neg  Weight loss due to AIDS   Hypoalbuminemia due to AIDS   Discussed the management with the patient, pharmacist  and hospitalist. Pt does not have medication insurance coverage - need to make arrangements to get meds from [facility] and arrange for RW

## 2021-07-03 NOTE — Progress Notes (Signed)
Pharmacy Antibiotic Note  Kent Page is a 31 y.o. male admitted on 06/26/2021 with possible pneumocystis pneumonia.  Pharmacy has been consulted for trimethoprim/sulfamethoxazole dosing. Presenting from ID clinic with shortness of breath.  He is newly diagnosed with HIV with a CD4 = 29.    Today, 07/03/2021 Day #7 TMP/SMZ CXR with Bilateral ground glass opacities WBC 13.4 No fevers SCr relatively stable at 1.10 K+ stable at 4.4 BAL performed 5/17 low level CMV Count in BAL and blood- watch with pt on prednisone per ID note Pneumocystis PCR: +  Plan: Transition to ORAL bactrim today 5/23  (TMP/SMZ DS - 2 tablets po q8h) Monitor renal function and K+  Prednisone restarted  Height: _0  (167.6 cm) Weight: 52.2 kg (115 lb 1.3 oz) IBW/kg (Calculated) : 63.8  Temp (24hrs), Avg:97.7 F (36.5 C), Min:97.1 F (36.2 C), Max:98.3 F (36.8 C)  Recent Labs  Lab 06/26/21 1726 06/27/21 0429 06/27/21 0613 06/28/21 0451 06/29/21 1353 06/30/21 0459 07/02/21 0533 07/03/21 0416 07/03/21 0931  WBC  --   --  6.4  --   --  11.0*  --   --  13.4*  CREATININE  --    < >  --    < > 1.22 1.12 1.04 1.03 1.10  LATICACIDVEN 0.8  --   --   --   --   --   --   --   --    < > = values in this interval not displayed.     Estimated Creatinine Clearance: 70.5 mL/min (by C-G formula based on SCr of 1.1 mg/dL).    No Known Allergies  Antimicrobials this admission: TMP/SMZ 5/16 >>   Microbiology results: 5/16 Bcx: NG 5/17 BAL: low level CMV Count in BAL and blood- watch with pt on prednisone per ID note 5/18 Bcx   Thank you for allowing pharmacy to be a part of this patient's care.  Chinita Greenland PharmD Clinical Pharmacist 07/03/2021

## 2021-07-03 NOTE — Plan of Care (Signed)
Pt AAOx4, no pain. Pt says he is less SOB today. VS are stable, possible d/c today. Bed is in lowest position, call light within reach. Will continue to monitor.

## 2021-07-04 ENCOUNTER — Other Ambulatory Visit: Payer: Self-pay

## 2021-07-04 ENCOUNTER — Telehealth: Payer: Self-pay

## 2021-07-04 DIAGNOSIS — J189 Pneumonia, unspecified organism: Secondary | ICD-10-CM | POA: Diagnosis not present

## 2021-07-04 LAB — BASIC METABOLIC PANEL
Anion gap: 9 (ref 5–15)
BUN: 14 mg/dL (ref 6–20)
CO2: 21 mmol/L — ABNORMAL LOW (ref 22–32)
Calcium: 8.5 mg/dL — ABNORMAL LOW (ref 8.9–10.3)
Chloride: 103 mmol/L (ref 98–111)
Creatinine, Ser: 1.03 mg/dL (ref 0.61–1.24)
GFR, Estimated: >60 mL/min (ref >60)
Glucose, Bld: 121 mg/dL — ABNORMAL HIGH (ref 70–99)
Potassium: 4.4 mmol/L (ref 3.5–5.1)
Sodium: 133 mmol/L — ABNORMAL LOW (ref 135–145)

## 2021-07-04 MED ORDER — SULFAMETHOXAZOLE-TRIMETHOPRIM 800-160 MG PO TABS
2.0000 | ORAL_TABLET | Freq: Three times a day (TID) | ORAL | 0 refills | Status: DC
  Filled 2021-07-04: qty 80, 14d supply, fill #0

## 2021-07-04 MED ORDER — PREDNISONE 10 MG PO TABS
ORAL_TABLET | ORAL | 0 refills | Status: AC
  Filled 2021-07-04: qty 34, 14d supply, fill #0

## 2021-07-04 MED ORDER — BICTEGRAVIR-EMTRICITAB-TENOFOV 50-200-25 MG PO TABS: 1.0000 | ORAL_TABLET | Freq: Every day | ORAL | Status: DC

## 2021-07-04 NOTE — Progress Notes (Signed)
PULMONOLOGY         Date: 07/04/2021,   MRN# [id number] Kent Page [birth year]     AdmissionWeight: 54.4 kg                 CurrentWeight: 53.6 kg  Referring provider: Dr. 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  Recent CD4 count of 29.  Was setting up plan with infectious disease for AIDS therapy.  Had concerns for PJP pneumonia and was sent to the hospital to evaluate for this.  Reports ongoing progressive shortness of breath from April denies sick contacts does admit to productive cough denies fevers chills hemoptysis has been anorexic with over 20 pound weight loss in the last few months.  Generally smokes marijuana and cigarettes daily does use alcohol but declines if he is with alcoholism has not had seizures in the past.  In the ED was noted to be hypotensive tachycardic with hypoxemia on mild exertion mild AKI with creatinine 1.1 otherwise electrolytes are fairly preserved received resuscitative fluids pulmonary consultation for bronchoscopic airway inspection and microbiology with bronchoalveolar lavage.  06/27/21- met with patient explained planned procedure , he consents to endotracheal intubation and bronchoscopy with BAL and planned airway inspection with therapeutic aspiration of tracheobronchial tree. Patient is agreeable.  Reviewed risks/complications and benefits with patient, risks include infection, pneumothorax/pneumomediastinum which may require chest tube placement as well as overnight/prolonged hospitalization and possible mechanical ventilation. Other risks include bleeding and very rarely death.  Patient understands risks and wishes to proceed.  Additional questions were answered, and patient is aware that post procedure patient will be going home  with family and may experience cough with possible clots on expectoration as well as phlegm which may last few days as well as hoarseness of voice post intubation and mechanical ventilation.  06/28/2021-patient seen and evaluated at bedside.  Remains on room air with no shortness of breath and normoxia.  He did have a fever overnight however this has resolved and he feels asymptomatic.  Microbiology from bronchoalveolar lavage is still in process.  Patient is stable and may possibly have outpatient therapy if he continues to improve.  06/29/21- patient is stable , he is nonfebrile. He had normal well formed BM.  He is urinating well and he is eating well. He is cleared for dc home from pulmonary perspective.  Thus far nothing has grown from BAL microbiology.   06/30/21- patient with febrile episode overnight.  Remains stable though.  Microbiology unrevealing thus far. CMV pcr as below. His HIV meds are being held for now PJP workup in process. Renal function stable. CBC with mild leukocytosis and anemia.  He vomited this morning states stomach was feeling sick after meal.  He has dyspnea with mild exertion and tachycardia. Since his renal function is good and we still do not have a definitive diagnosis to explain why he is tachycaric and dyspneic on mild exertion , I will obtain CT angio PE protocol   0 Result Notes     Component Ref Range & Units 3 d ago  CMV DNA Quant Negative IU/mL 498   Comment: The quantitative range of this assay is 200 to 1 million IU/mL.  Log10 CMV Qn DNA Pl log10 IU/mL 2.697   Comment: (NOTE)  Performed At: [facility]  [address] [place], [place] [id number]         07/02/21- Patient + for PJP.  We reviewed Chest CT images together because he had questions regarding severity and areas of lung affected.  He is on bactrim IV TID per infectious disease.  He is feeling ok at rest but dyspenic with minimal exertion.     07/03/21- patient is feeling better.  He  reports constipation and ongoing dyspnea with exertion when ambulating around hallway.  He is eating well and was able to wash himself but was feeling severe shortness of breath.  Overall he still reports interval improvement. He remain on HIV medications as well as PJP therapy with bactrim and prednisone.   07/04/21- patient seen and examined at bedside he is tolerating HIV meds well. He is improved without signs of immune reconstitution. He is cleared for dc home from pulmonary perspective   PAST MEDICAL HISTORY   Past Medical History:  Diagnosis Date   HIV (human immunodeficiency virus infection) ([place])      SURGICAL HISTORY   History reviewed. No pertinent surgical history.   FAMILY HISTORY   History reviewed. No pertinent family history.   SOCIAL HISTORY   Social History   Tobacco Use   Smoking status: Former    Types: Cigarettes    Passive exposure: Past   Smokeless tobacco: Never  Vaping Use   Vaping Use: Never used  Substance Use Topics   Alcohol use: Yes    Comment: daily   Drug use: Not Currently     MEDICATIONS    Home Medication:  Current Outpatient Rx   Order #: [id number] Class: Normal   Order #: [id number] Class: Normal     Current Medication:  Current Facility-Administered Medications:    0.9 %  sodium chloride infusion, 250 mL, Intravenous, PRN, Pokhrel, Laxman, MD, Stopped at 06/27/21 1800   0.9 %  sodium chloride infusion, 250 mL, Intravenous, Continuous, Jackolyn Geron, MD, Stopped at 06/29/21 0516   acetaminophen (TYLENOL) tablet 650 mg, 650 mg, Oral, Q6H PRN, 650 mg at 06/29/21 2037 **OR** acetaminophen (TYLENOL) suppository 650 mg, 650 mg, Rectal, Q6H PRN, Pokhrel, Laxman, MD   albuterol (PROVENTIL) (2.5 MG/3ML) 0.083% nebulizer solution 2.5 mg, 2.5 mg, Nebulization, Q4H PRN, Pokhrel, Laxman, MD   bictegravir-emtricitabine-tenofovir AF (BIKTARVY) 50-200-25 MG per tablet 1 tablet, 1 tablet, Oral, Daily, Ravishankar, Jayashree, MD, 1 tablet at  07/04/21 0946   enoxaparin (LOVENOX) injection 40 mg, 40 mg, Subcutaneous, Q24H, Pokhrel, Laxman, MD, 40 mg at 07/03/21 2258   multivitamin with minerals tablet 1 tablet, 1 tablet, Oral, Daily, Lorella Nimrod, MD, 1 tablet at 07/03/21 2258   ondansetron (ZOFRAN) tablet 4 mg, 4 mg, Oral, Q6H PRN **OR** ondansetron (ZOFRAN) injection 4 mg, 4 mg, Intravenous, Q6H PRN, Pokhrel, Laxman, MD, 4 mg at 06/30/21 0734   pantoprazole (PROTONIX) EC tablet 40 mg, 40 mg, Oral, Daily, Pokhrel, Laxman, MD, 40 mg at 07/04/21 0946   polyethylene glycol (MIRALAX / GLYCOLAX) packet 17 g, 17 g, Oral, Daily, Amin, Soundra Pilon, MD, 17 g at 07/04/21 0946   [EXPIRED] predniSONE (DELTASONE) tablet 40 mg, 40 mg, Oral, BID WC, 40 mg at 07/02/21 1702 **FOLLOWED BY** predniSONE (DELTASONE) tablet 40 mg, 40 mg, Oral, Q breakfast, Ravishankar, Jayashree, MD, 40 mg at 07/04/21 0946   senna-docusate (Senokot-S) tablet 1 tablet, 1 tablet, Oral, QHS PRN, Pokhrel, Laxman, MD   sodium chloride flush (NS) 0.9 % injection 3 mL, 3 mL, Intravenous, Q12H, Pokhrel, Laxman, MD, 3 mL at 07/04/21 [phone]  sodium chloride flush (NS) 0.9 % injection 3 mL, 3 mL, Intravenous, PRN, Pokhrel, Laxman, MD   sulfamethoxazole-trimethoprim (BACTRIM DS) 800-160 MG per tablet 2 tablet, 2 tablet, Oral, Q8H, Ravishankar, Jayashree, MD, 2 tablet at 07/04/21 0606    ALLERGIES   Patient has no known allergies.     REVIEW OF SYSTEMS    Review of Systems:  Gen:  Denies  fever, sweats, chills weigh loss  HEENT: Denies blurred vision, double vision, ear pain, eye pain, hearing loss, nose bleeds, sore throat Cardiac:  No dizziness, chest pain or heaviness, chest tightness,edema Resp:   reports dyspnea chronically  Gi: Denies swallowing difficulty, stomach pain, nausea or vomiting, diarrhea, constipation, bowel incontinence Gu:  Denies bladder incontinence, burning urine Ext:   Denies Joint pain, stiffness or swelling Skin: Denies  skin rash, easy bruising or  bleeding or hives Endoc:  Denies polyuria, polydipsia , polyphagia or weight change Psych:   Denies depression, insomnia or hallucinations   Other:  All other systems negative   VS: BP 101/76 (BP Location: Left Arm)   Pulse 90   Temp 98 F (36.7 C) (Oral)   Resp 18   Ht _0  (1.676 m)   Wt 53.6 kg   SpO2 99%   BMI 19.07 kg/m      PHYSICAL EXAM    GENERAL:NAD, no fevers, chills, no weakness no fatigue HEAD: Normocephalic, atraumatic.  EYES: Pupils equal, round, reactive to light. Extraocular muscles intact. No scleral icterus.  MOUTH: Moist mucosal membrane. Dentition intact. No abscess noted.  EAR, NOSE, THROAT: Clear without exudates. No external lesions.  NECK: Supple. No thyromegaly. No nodules. No JVD.  PULMONARY: decreased breath sounds with mild rhonchi worse at bases bilaterally.  CARDIOVASCULAR: S1 and S2. Regular rate and rhythm. No murmurs, rubs, or gallops. No edema. Pedal pulses 2+ bilaterally.  GASTROINTESTINAL: Soft, nontender, nondistended. No masses. Positive bowel sounds. No hepatosplenomegaly.  MUSCULOSKELETAL: No swelling, clubbing, or edema. Range of motion full in all extremities.  NEUROLOGIC: Cranial nerves II through XII are intact. No gross focal neurological deficits. Sensation intact. Reflexes intact.  SKIN: No ulceration, lesions, rashes, or cyanosis. Skin warm and dry. Turgor intact.  PSYCHIATRIC: Mood, affect within normal limits. The patient is awake, alert and oriented x 3. Insight, judgment intact.       IMAGING     ASSESSMENT/PLAN   Acute hypoxemic respiratory failure- - due to bilateral pneumonia- due to PJP in context of AIDS Patient with active AIDS and is under the care of infectious disease team in process of receiving therapy -Status post ID evaluation-appreciate input -Treatment with bactrim tid  -Patient reports some improvement since hospitalization and having resuscitative fluids #Patient is currently normoxic on room air  at rest but hypoxemic with activity     Centrilobular emphysema -Active tobacco and THC abuse.  Smoking cessation counseling provided      AIDS CD4 less than 50 -Infectious disease on case-appreciate input -on HIV medication now   Thank you for allowing me to participate in the care of this patient.   Patient/Family are satisfied with care plan and all questions have been answered.    Provider disclosure: Patient with at least one acute or chronic illness or injury that poses a threat to life or bodily function and is being managed actively during this encounter.  All of the below services have been performed independently by signing provider:  review of prior documentation from internal and or external health records.  Review of  previous and current lab results.  Interview and comprehensive assessment during patient visit today. Review of current and previous chest radiographs/CT scans. Discussion of management and test interpretation with health care team and patient/family.   This document was prepared using Dragon voice recognition software and may include unintentional dictation errors.     Ottie Glazier, M.D.  Division of Pulmonary & Critical Care Medicine

## 2021-07-04 NOTE — Discharge Summary (Signed)
Kent Page EHU:[id number] DOB: [birth year] DOA: 06/26/2021  PCP: [facility] [place] date: 06/26/2021 Discharge date: 07/04/2021  Time spent: 45 minutes  Recommendations for Outpatient Follow-up:  Outpatient ID f/u     Discharge Diagnoses:  Principal Problem:   Pneumonia Active Problems:   AIDS ([place])   SOB (shortness of breath)   Lactate blood increased   Protein-calorie malnutrition, severe ([place])   Discharge Condition: stable  Diet recommendation: regular  Filed Weights   07/02/21 0500 07/03/21 0500 07/04/21 0500  Weight: 51.4 kg 52.2 kg 53.6 kg    History of present illness:  From admission h and p Patient is a 31 years old male with past medical history of recently diagnosed HIV was sent from ID clinic with concerns for increasing shortness of breath with hypoxia noted in the clinic with pulse ox in the 80s is in room air.  Patient was newly diagnosed of HIV/AIDs recently with a CD4 count of 29.  He was in the process of setting up treatment plan with ID   There was some concern for PJP pneumonia and the patient was sent to the hospital.  Patient states that he has been having dyspnea and shortness of breath since April which has been gradually worsening at this time.  He does have dyspnea on exertion and has mostly cough which is dry in nature with some productive cough in the morning.  Denies any fever,chills or rigor.  Denies any chest pain.  No hemoptysis.  Denies any sick contacts.  Does have a diminished appetite and has lost around 20 pounds of weight loss since April.  Denies any nausea vomiting or diarrhea.  Denies any urinary urgency frequency or dysuria.  He states that he has been smoking marijuana but denies smoking cigarettes recently.  Used to drink alcohol but has quit since March 2023.  Hospital Course:  Patient admitted with PJP pneumonia. Treated with IV bactrim and steroids, will discharge to complete course of oral bactrim and  prednisone. Recently diagnosed with AIDS, will discharge on bictarvy and will f/u with [facility] in [place] on 5/31. Symptoms much improved. Ambulates without difficulty, is tolerating diet.   Procedures: Bronchoscopy with bal  Consultations: ID, pulmonology  Discharge Exam: Vitals:   07/03/21 1928 07/04/21 0409  BP: 111/76 101/76  Pulse: 85 90  Resp: 20 18  Temp: (!) 97.5 F (36.4 C) 98 F (36.7 C)  SpO2: 98% 99%    General: NAD Cardiovascular: RRR Respiratory: CTAB Abdomen: soft, non-tender  Discharge Instructions   Discharge Instructions     Diet - low sodium heart healthy   Complete by: As directed    Increase activity slowly   Complete by: As directed       Allergies as of 07/04/2021   No Known Allergies      Medication List     STOP taking these medications    levofloxacin 500 MG tablet Commonly known as: Levaquin       TAKE these medications    bictegravir-emtricitabine-tenofovir AF 50-200-25 MG Tabs tablet Commonly known as: BIKTARVY Take 1 tablet by mouth daily. Start taking on: Jul 05, 2021   predniSONE 10 MG tablet Commonly known as: DELTASONE Take 4 tablets (40 mg total) by mouth daily with breakfast for 3 days, THEN 2 tablets (20 mg total) daily with breakfast for 11 days. Start taking on: Jul 04, 2021   sulfamethoxazole-trimethoprim 800-160 MG tablet Commonly known as: BACTRIM DS Take 2 tablets by mouth in the  morning, at noon, and at bedtime.       No Known Allergies  Follow-up Information     Tommy Medal, Lavell Islam, MD Follow up.   Specialty: Infectious Diseases Why: 5/31 @ 2:00 PM Contact information: [address] [place] [address] [phone]                  The results of significant diagnostics from this hospitalization (including imaging, microbiology, ancillary and laboratory) are listed below for reference.    Significant Diagnostic Studies: DG Chest 2 View  Result Date:  06/26/2021 CLINICAL DATA:  Shortness of breath.  Recently diagnosed with HIV. EXAM: CHEST - 2 VIEW COMPARISON:  Radiographs 06/19/2021 and 11/21/2017. FINDINGS: The heart size and mediastinal contours are stable without evidence of adenopathy. There is no pleural effusion or pneumothorax. Interval increased ground-glass opacities throughout both lungs suspicious for atypical pneumonia. No consolidation or focal nodularity apparent. The bones appear unremarkable. Telemetry leads overlie the chest. IMPRESSION: Progressive ground-glass opacities throughout both lungs suspicious for atypical pneumonia. No significant pleural effusion. Electronically Signed   By: Richardean Sale M.D.   On: 06/26/2021 12:27   DG Chest 2 View  Result Date: 06/19/2021 CLINICAL DATA:  Shortness of breath, rash, loss of appetite, symptoms for about a year recently worsened, 20 pounds weight loss EXAM: CHEST - 2 VIEW COMPARISON:  11/21/2017 FINDINGS: Normal heart size, mediastinal contours, and pulmonary vascularity. Hazy BILATERAL perihilar infiltrates extending towards bases question atypical pneumonia or less likely noncardiogenic edema. No segmental consolidation, pleural effusion, or pneumothorax. Minimal levoconvex upper thoracic scoliosis. IMPRESSION: Hazy BILATERAL perihilar to basilar infiltrates question atypical pneumonia or less likely pulmonary edema. Electronically Signed   By: Lavonia Dana M.D.   On: 06/19/2021 13:13   CT Angio Chest Pulmonary Embolism (PE) W or WO Contrast  Result Date: 06/30/2021 CLINICAL DATA:  Concern for pulmonary embolism. EXAM: CT ANGIOGRAPHY CHEST WITH CONTRAST TECHNIQUE: Multidetector CT imaging of the chest was performed using the standard protocol during bolus administration of intravenous contrast. Multiplanar CT image reconstructions and MIPs were obtained to evaluate the vascular anatomy. RADIATION DOSE REDUCTION: This exam was performed according to the departmental dose-optimization program  which includes automated exposure control, adjustment of the mA and/or kV according to patient size and/or use of iterative reconstruction technique. CONTRAST:  70m OMNIPAQUE IOHEXOL 350 MG/ML SOLN COMPARISON:  Chest radiograph dated 06/28/2021. FINDINGS: Evaluation of this exam is limited due to respiratory motion artifact. Cardiovascular: There is no cardiomegaly or pericardial effusion. The thoracic aorta is unremarkable. Evaluation of the pulmonary arteries is limited due to respiratory motion artifact. No pulmonary artery embolus identified. Mediastinum/Nodes: No hilar or mediastinal adenopathy. The esophagus and the thyroid gland are grossly unremarkable. No mediastinal fluid collection. Lungs/Pleura: Diffuse bilateral ground-glass airspace opacities may represent edema, pneumonia, or ARDS. Clinical correlation is recommended. No pleural effusion or pneumothorax. The central airways are patent. Upper Abdomen: No acute abnormality. Musculoskeletal: No chest wall abnormality. No acute or significant osseous findings. Review of the MIP images confirms the above findings. IMPRESSION: 1. No CT evidence of pulmonary embolism. 2. Diffuse bilateral ground-glass airspace opacities may represent edema, pneumonia, or ARDS. Electronically Signed   By: AAnner CreteM.D.   On: 06/30/2021 21:24   DG Chest Port 1 View  Result Date: 06/28/2021 CLINICAL DATA:  Shortness of, history of HIV EXAM: PORTABLE CHEST 1 VIEW COMPARISON:  Chest radiograph 06/26/2021 FINDINGS: The cardiomediastinal silhouette is stable. There is patchy airspace disease most prominent in  the right lower lobe, slightly worsened since 06/26/2021. There is no pleural effusion. There is no pneumothorax. There is no acute osseous abnormality. IMPRESSION: Patchy airspace disease in the right lung has slightly worsened since 06/26/2021. Electronically Signed   By: Valetta Mole M.D.   On: 06/28/2021 09:27    Microbiology: Recent Results (from the past  240 hour(s))  Resp Panel by RT-PCR (Flu A&B, Covid) Nasopharyngeal Swab     Status: None   Collection Time: 06/26/21 11:50 AM   Specimen: Nasopharyngeal Swab; Nasopharyngeal(NP) swabs in vial transport medium  Result Value Ref Range Status   SARS Coronavirus 2 by RT PCR NEGATIVE NEGATIVE Final    Comment: (NOTE) SARS-CoV-2 target nucleic acids are NOT DETECTED.  The SARS-CoV-2 RNA is generally detectable in upper respiratory specimens during the acute phase of infection. The lowest concentration of SARS-CoV-2 viral copies this assay can detect is 138 copies/mL. A negative result does not preclude SARS-Cov-2 infection and should not be used as the sole basis for treatment or other patient management decisions. A negative result may occur with  improper specimen collection/handling, submission of specimen other than nasopharyngeal swab, presence of viral mutation(s) within the areas targeted by this assay, and inadequate number of viral copies(<138 copies/mL). A negative result must be combined with clinical observations, patient history, and epidemiological information. The expected result is Negative.  Fact Sheet for Patients:  EntrepreneurPulse.com.au  Fact Sheet for Healthcare Providers:  IncredibleEmployment.be  This test is no t yet approved or cleared by the [place] FDA and  has been authorized for detection and/or diagnosis of SARS-CoV-2 by FDA under an Emergency Use Authorization (EUA). This EUA will remain  in effect (meaning this test can be used) for the duration of the COVID-19 declaration under Section 564(b)(1) of the Act, 21 U.S.C.section 360bbb-3(b)(1), unless the authorization is terminated  or revoked sooner.       Influenza A by PCR NEGATIVE NEGATIVE Final   Influenza B by PCR NEGATIVE NEGATIVE Final    Comment: (NOTE) The Xpert Xpress SARS-CoV-2/FLU/RSV plus assay is intended as an aid in the diagnosis of influenza  from Nasopharyngeal swab specimens and should not be used as a sole basis for treatment. Nasal washings and aspirates are unacceptable for Xpert Xpress SARS-CoV-2/FLU/RSV testing.  Fact Sheet for Patients: EntrepreneurPulse.com.au  Fact Sheet for Healthcare Providers: IncredibleEmployment.be  This test is not yet approved or cleared by the [place] FDA and has been authorized for detection and/or diagnosis of SARS-CoV-2 by FDA under an Emergency Use Authorization (EUA). This EUA will remain in effect (meaning this test can be used) for the duration of the COVID-19 declaration under Section 564(b)(1) of the Act, 21 U.S.C. section 360bbb-3(b)(1), unless the authorization is terminated or revoked.  Performed at [facility], [address] [place], [place] [address]   Culture, blood (routine x 2)     Status: None   Collection Time: 06/26/21  1:31 PM   Specimen: BLOOD  Result Value Ref Range Status   Specimen Description BLOOD LEFT FA  Final   Special Requests   Final    BOTTLES DRAWN AEROBIC AND ANAEROBIC Blood Culture results may not be optimal due to an excessive volume of blood received in culture bottles   Culture   Final    NO GROWTH 5 DAYS Performed at [facility], [address] [place], [place] [address]    Report Status 07/01/2021 FINAL  Final  Culture, blood (Routine X 2) w Reflex  to ID Panel     Status: None   Collection Time: 06/26/21  9:12 PM   Specimen: BLOOD  Result Value Ref Range Status   Specimen Description BLOOD LAC  Final   Special Requests BOTTLES DRAWN AEROBIC AND ANAEROBIC BCLV  Final   Culture   Final    NO GROWTH 5 DAYS Performed at [facility], [address] [place], [place] [address]    Report Status 07/01/2021 FINAL  Final  Respiratory (~20 pathogens) panel by PCR     Status: None   Collection Time: 06/26/21 11:35 PM  Result Value Ref Range Status   Adenovirus NOT  DETECTED NOT DETECTED Final   Coronavirus 229E NOT DETECTED NOT DETECTED Final    Comment: (NOTE) The Coronavirus on the Respiratory Panel, DOES NOT test for the novel  Coronavirus (2019 nCoV)    Coronavirus HKU1 NOT DETECTED NOT DETECTED Final   Coronavirus NL63 NOT DETECTED NOT DETECTED Final   Coronavirus OC43 NOT DETECTED NOT DETECTED Final   Metapneumovirus NOT DETECTED NOT DETECTED Final   Rhinovirus / Enterovirus NOT DETECTED NOT DETECTED Final   Influenza A NOT DETECTED NOT DETECTED Final   Influenza B NOT DETECTED NOT DETECTED Final   Parainfluenza Virus 1 NOT DETECTED NOT DETECTED Final   Parainfluenza Virus 2 NOT DETECTED NOT DETECTED Final   Parainfluenza Virus 3 NOT DETECTED NOT DETECTED Final   Parainfluenza Virus 4 NOT DETECTED NOT DETECTED Final   Respiratory Syncytial Virus NOT DETECTED NOT DETECTED Final   Bordetella pertussis NOT DETECTED NOT DETECTED Final   Bordetella Parapertussis NOT DETECTED NOT DETECTED Final   Chlamydophila pneumoniae NOT DETECTED NOT DETECTED Final   Mycoplasma pneumoniae NOT DETECTED NOT DETECTED Final    Comment: Performed at [facility], [address] [place], [place] [address]  MRSA Next Gen by PCR, Nasal     Status: None   Collection Time: 06/27/21 11:39 AM   Specimen: Nasal Mucosa; Nasal Swab  Result Value Ref Range Status   MRSA by PCR Next Gen NOT DETECTED NOT DETECTED Final    Comment: (NOTE) The GeneXpert MRSA Assay (FDA approved for NASAL specimens only), is one component of a comprehensive MRSA colonization surveillance program. It is not intended to diagnose MRSA infection nor to guide or monitor treatment for MRSA infections. Test performance is not FDA approved in patients less than 73 years old. Performed at [facility], [address] [place], [place] [address]   Culture, fungus without smear     Status: None (Preliminary result)   Collection Time: 06/27/21  1:11 PM   Specimen: Bronchoalveolar  Lavage; Lung  Result Value Ref Range Status   Specimen Description   Final    BRONCHIAL ALVEOLAR LAVAGE Performed at [facility], [address], [place], [place] [address]    Special Requests   Final    NONE Performed at [facility], [address] [place], [place] [address]    Culture   Final    NO FUNGUS ISOLATED AFTER 7 DAYS Performed at [facility], [place] [address], [place], [place] [address]    Report Status PENDING  Incomplete  Aspergillus Ag, BAL/Serum     Status: None   Collection Time: 06/27/21  1:11 PM   Specimen: Bronchial Alveolar Lavage; Respiratory  Result Value Ref Range Status   Aspergillus Ag, BAL/Serum 0.06 0.00 - 0.49 Index Final    Comment: (NOTE) Performed At: [facility] [address]  [address] [place], [place] [id number] Rush Farmer MD [id number]   Virus culture     Status: None   Collection Time: 06/27/21  1:11 PM   Specimen: Bronchial Alveolar Lavage; Lung  Result Value Ref Range Status   Viral Culture Comment  Final    Comment: (NOTE) Preliminary Report: No virus isolated at 4 days.  Next report to follow after 7 days. Performed At: [facility] [place], [place] [id number] Rush Farmer MD [id number]    Source of Sample BRONCHIAL ALVEOLAR LAVAGE  Final    Comment: Performed at [facility], [address] [place], [place] [address]  Culture, BAL-quantitative w Gram Stain     Status: None   Collection Time: 06/27/21  1:11 PM   Specimen: Bronchoalveolar Lavage; Respiratory  Result Value Ref Range Status   Specimen Description   Final    BRONCHIAL ALVEOLAR LAVAGE Performed at [facility], [address] [place], [place] [address]    Special Requests   Final    NONE Performed at [facility], [place], [place] [address]    Gram Stain NO WBC SEEN NO ORGANISMS SEEN   Final   Culture   Final    NO GROWTH 2 DAYS Performed at [facility], [place] [address], [place], [place] [address]    Report Status 06/30/2021 FINAL  Final  Anaerobic culture w Gram Stain     Status: None   Collection Time: 06/27/21  1:11 PM   Specimen: Bronchoalveolar Lavage; Lung  Result Value Ref Range Status   Specimen Description   Final    BRONCHIAL ALVEOLAR LAVAGE Performed at [facility], [address] [place], [place] [address]    Special Requests   Final    NONE Performed at [facility], [address] [place], [place] [address]    Culture   Final    NO ANAEROBES ISOLATED Performed at [facility], [place] [address] [place], [place] [address]    Report Status 07/02/2021 FINAL  Final  Culture, blood (Routine X 2) w Reflex to ID Panel     Status: None   Collection Time: 06/28/21  9:56 AM   Specimen: BLOOD  Result Value Ref Range Status   Specimen Description BLOOD RIGHT HAND  Final   Special Requests   Final    BOTTLES DRAWN AEROBIC AND ANAEROBIC Blood Culture adequate volume   Culture   Final    NO GROWTH 5 DAYS Performed at [facility], [address] [place], [place] [address]    Report Status 07/03/2021 FINAL  Final  Culture, blood (Routine X 2) w Reflex to ID Panel     Status: None   Collection Time: 06/28/21  9:56 AM   Specimen: BLOOD  Result Value Ref Range Status   Specimen Description BLOOD RIGHT ARM  Final   Special Requests   Final    BOTTLES DRAWN AEROBIC AND ANAEROBIC Blood Culture adequate volume   Culture   Final    NO GROWTH 5 DAYS Performed at [facility], [address], [place], [place] [address]    Report Status 07/03/2021 FINAL  Final     Labs: Basic Metabolic Panel: Recent Labs  Lab 06/28/21 0451 06/29/21 0353 06/29/21 1353 06/30/21 0459 07/01/21 0445 07/02/21 0533 07/03/21 0416 07/03/21 0931 07/04/21 0359  NA 135  --  134*  --   --  133*  --  132* 133*  K 4.5  --  4.6 4.4  --  4.7 5.7* 4.4 4.4  CL 105  --  106  --   --  103  --   100 103  CO2 22  --  21*  --   --  19*  --  22 21*  GLUCOSE 99  --  102*  --   --  163*  --  92 121*  BUN 10  --  8  --   --  13  --  12 14  CREATININE 1.19  --  1.22 1.12  --  1.04 1.03 1.10 1.03  CALCIUM 8.1*  --  8.1*  --   --  8.4*  --  8.7* 8.5*  MG  --  2.0  --  2.1 2.3  --   --   --   --   PHOS  --  3.1  --  3.3 3.0  --   --   --   --    Liver Function Tests: No results for input(s): AST, ALT, ALKPHOS, BILITOT, PROT, ALBUMIN in the last 168 hours. No results for input(s): LIPASE, AMYLASE in the last 168 hours. No results for input(s): AMMONIA in the last 168 hours. CBC: Recent Labs  Lab 06/30/21 0459 07/03/21 0931  WBC 11.0* 13.4*  NEUTROABS  --  10.9*  HGB 10.7* 12.2*  HCT 32.0* 36.6*  MCV 86.7 84.5  PLT 572* 772*   Cardiac Enzymes: No results for input(s): CKTOTAL, CKMB, CKMBINDEX, TROPONINI in the last 168 hours. BNP: BNP (last 3 results) No results for input(s): BNP in the last 8760 hours.  ProBNP (last 3 results) No results for input(s): PROBNP in the last 8760 hours.  CBG: No results for input(s): GLUCAP in the last 168 hours.     Signed:  Desma Maxim MD.  [facility] 07/04/2021, 2:12 PM

## 2021-07-04 NOTE — Progress Notes (Signed)
Nutrition Follow-up  DOCUMENTATION CODES:   Severe malnutrition in context of chronic illness  INTERVENTION:   -Continue double protein portions with meals -Continue MVI with minerals daily  NUTRITION DIAGNOSIS:   Severe Malnutrition related to catabolic illness (HIV/AIDS) as evidenced by percent weight loss, severe muscle depletion, moderate fat depletion, severe fat depletion.  Ongoing  GOAL:   Patient will meet greater than or equal to 90% of their needs  Progressing   MONITOR:   PO intake, Supplement acceptance, Labs, Weight trends, Skin, I & O's  REASON FOR ASSESSMENT:   Consult Assessment of nutrition requirement/status  ASSESSMENT:   31 y/o male with h/o newly diagnosed HIV/AIDS who is admitted with PNA.  Reviewed I/O's: +330 ml x 24 hours and +5.7 L since admission  Spoke with pt at bedside, who was pleasant and in good spirits today. He reports his appetite has vastly improved and has been consuming large meals. Family members have also been bringing in outside foods. Noted meal completions 100%.   Pt does not like Ensure supplements, states they are too thick and he had a bad experience after vomiting up an Ensure and chicken noodle soup. He does not wish to continue supplements at this time. RD discussed how to increase calories and protein in diet to preserve lean body mass. Pt admits that he has had a lot of social stressors including multiple family deaths, caring for his grandmother with dementia, moving back home to help his mom, and affording house repairs. 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 Pt has not further questions, but appreciative of RD visit.   Per MD notes, possible discharge home today.    Medications reviewed and include miralax and prenisone.   Labs reviewed: Na: 133.    Diet Order:   Diet Order             Diet  regular Room service appropriate? Yes; Fluid consistency: Thin  Diet effective now                   EDUCATION NEEDS:   Education needs have been addressed  Skin:  Skin Assessment: Reviewed RN Assessment  Last BM:  07/02/21  Height:   Ht Readings from Last 1 Encounters:  06/26/21 5\' 6"  (1.676 m)    Weight:   Wt Readings from Last 1 Encounters:  07/04/21 53.6 kg    Ideal Body Weight:  64.5 kg  BMI:  Body mass index is 19.07 kg/m.  Estimated Nutritional Needs:   Kcal:  1900-2200kcal/day  Protein:  95-110g/day  Fluid:  1.7-1.9L/day    07-20-1976, RD, LDN, CDCES Registered Dietitian II Certified Diabetes Care and Education Specialist Please refer to [facility] for RD and/or RD on-call/weekend/after hours pager

## 2021-07-04 NOTE — Progress Notes (Signed)
Pharmacy - Antimicrobial Stewardship  New diagnosis HIV, sent to ED from ID clinic for increasing shortness of breath. 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  He is thought to have medical coverage but [facility] outpatient pharmacies unable to locate medication coverage. When spoke to patient he said showed me what he was told to use.  It was a discount or savings card from [employer] and not actual insurance.  He did not qualify for biktarvy assistance via [place] d/t household income being too high.   We were able to get him a month supply of free biktarvy until able to f/u at [place] clinic to evaluate what other programs he may qualify  Prednisone and Bactrim were filled at medication mgmt clinic at no cost and delivered to patient room.  Spent 45mn reviewing all the medications, side effects, and directions for use.  He was able to repeat back the instructions.  He understands importance of taking his medications  [facility]  DDoreene Eland PharmD, BCPS, BCalpineWork Cell: [phone]/24/2023 3:52 PM

## 2021-07-04 NOTE — Telephone Encounter (Signed)
Patient has follow up appointment with Dr. Daiva Eves 5/31 - notified patient of appointment time and he requests a MyChart message with location be sent to him.   Sandie Ano, RN

## 2021-07-05 ENCOUNTER — Other Ambulatory Visit: Payer: Self-pay | Admitting: Pharmacist

## 2021-07-05 ENCOUNTER — Encounter: Payer: Self-pay | Admitting: Pharmacy Technician

## 2021-07-05 DIAGNOSIS — B20 Human immunodeficiency virus [HIV] disease: Secondary | ICD-10-CM

## 2021-07-05 LAB — VIRUS CULTURE

## 2021-07-05 MED ORDER — BIKTARVY 50-200-25 MG PO TABS: 1.0000 | ORAL_TABLET | Freq: Every day | ORAL | 0 refills | Status: DC

## 2021-07-05 NOTE — Progress Notes (Signed)
Medication Samples have been provided to the patient. ? ?Drug name: Biktarvy        ?Strength: 50/200/25 mg       ?Qty: 28 tablets (4 bottles)    ?LOT: [id number]   ?Exp.Date: 10/2023 ? ?Dosing instructions: Take one tablet by mouth once daily ? ?The patient has been instructed regarding the correct time, dose, and frequency of taking this medication, including desired effects and most common side effects.  ? ?Tomika Eckles L. Faria Casella, PharmD, BCIDP, AAHIVP, CPP ?Clinical Pharmacist Practitioner ?Infectious Diseases Clinical Pharmacist ?[facility] ?01/24/2020, 10:07 AM ? ?

## 2021-07-05 NOTE — Patient Outreach (Signed)
Patient only signed DOH Attestation.  Would need to provide current year's household income if PAP medications were needed. ? ?Aundrey Elahi J. Chinwe Lope ?Patient Advocate Specialist ?[facility]  ?

## 2021-07-06 ENCOUNTER — Other Ambulatory Visit (HOSPITAL_COMMUNITY): Payer: Self-pay

## 2021-07-10 ENCOUNTER — Other Ambulatory Visit (HOSPITAL_COMMUNITY): Payer: Self-pay

## 2021-07-10 ENCOUNTER — Telehealth: Payer: Self-pay

## 2021-07-10 NOTE — Telephone Encounter (Signed)
[facility] Patient Advocate Encounter ? ?Insurance verification completed.   ? ?The patient is uninsured and will need patient assistance for medication. ? ?We can complete the application and will need to meet with the patient for signatures and income documentation. ? ?Yarel Rushlow, CPhT ?Specialty Pharmacy Patient Advocate ?[facility] ?Phone: [phone] ?Fax:  [phone]  ?

## 2021-07-11 ENCOUNTER — Ambulatory Visit: Payer: No Typology Code available for payment source

## 2021-07-11 ENCOUNTER — Ambulatory Visit (INDEPENDENT_AMBULATORY_CARE_PROVIDER_SITE_OTHER): Payer: No Typology Code available for payment source | Admitting: Infectious Disease

## 2021-07-11 ENCOUNTER — Ambulatory Visit (INDEPENDENT_AMBULATORY_CARE_PROVIDER_SITE_OTHER): Payer: No Typology Code available for payment source | Admitting: Pharmacist

## 2021-07-11 ENCOUNTER — Other Ambulatory Visit (HOSPITAL_COMMUNITY): Payer: Self-pay

## 2021-07-11 ENCOUNTER — Other Ambulatory Visit: Payer: Self-pay

## 2021-07-11 ENCOUNTER — Encounter: Payer: Self-pay | Admitting: Infectious Disease

## 2021-07-11 VITALS — BP 128/85 | HR 90 | Temp 97.8°F | Wt 127.0 lb

## 2021-07-11 DIAGNOSIS — Z23 Encounter for immunization: Secondary | ICD-10-CM | POA: Diagnosis not present

## 2021-07-11 DIAGNOSIS — B59 Pneumocystosis: Secondary | ICD-10-CM | POA: Diagnosis not present

## 2021-07-11 DIAGNOSIS — Z7185 Encounter for immunization safety counseling: Secondary | ICD-10-CM

## 2021-07-11 DIAGNOSIS — B259 Cytomegaloviral disease, unspecified: Secondary | ICD-10-CM | POA: Diagnosis not present

## 2021-07-11 DIAGNOSIS — B20 Human immunodeficiency virus [HIV] disease: Secondary | ICD-10-CM | POA: Diagnosis not present

## 2021-07-11 DIAGNOSIS — B370 Candidal stomatitis: Secondary | ICD-10-CM | POA: Insufficient documentation

## 2021-07-11 HISTORY — DX: Pneumocystosis: B59

## 2021-07-11 HISTORY — DX: Cytomegaloviral disease, unspecified: B25.9

## 2021-07-11 HISTORY — DX: Candidal stomatitis: B37.0

## 2021-07-11 MED ORDER — SULFAMETHOXAZOLE-TRIMETHOPRIM 800-160 MG PO TABS
1.0000 | ORAL_TABLET | Freq: Every day | ORAL | 7 refills | Status: DC
  Filled 2021-07-11: qty 30, 30d supply, fill #0

## 2021-07-11 MED ORDER — BICTEGRAVIR-EMTRICITAB-TENOFOV 50-200-25 MG PO TABS: 1.0000 | ORAL_TABLET | Freq: Every day | ORAL | 11 refills | Status: DC

## 2021-07-11 MED ORDER — SULFAMETHOXAZOLE-TRIMETHOPRIM 800-160 MG PO TABS: 1.0000 | ORAL_TABLET | Freq: Every day | ORAL | 7 refills | Status: DC

## 2021-07-11 MED ORDER — FLUCONAZOLE 200 MG PO TABS: ORAL_TABLET | ORAL | 2 refills | Status: DC

## 2021-07-11 MED ORDER — BIKTARVY 50-200-25 MG PO TABS: 1.0000 | ORAL_TABLET | Freq: Every day | ORAL | 11 refills | Status: DC

## 2021-07-11 MED ORDER — FLUCONAZOLE 200 MG PO TABS
ORAL_TABLET | ORAL | 2 refills | Status: DC
  Filled 2021-07-11: qty 15, 13d supply, fill #0

## 2021-07-11 NOTE — Progress Notes (Signed)
Subjective:  Chief complaint: Dry tongue and mouth  Patient ID: Kent Page, male    DOB: 04-May-1990, 31 y.o.   MRN: [id number]  HPI  Kent Page is a 31 year old black man recently diagnosed with HIV and AIDS and hospitalized at [facility] with presumed PCP pneumonia.  His PCP has responded nicely to Bactrim and prednisone and he is now down to 20 mg of prednisone.  He is developed thrush while on the high-dose steroids antibiotics.  He is otherwise tolerating Biktarvy without any problems.    Past Medical History:  Diagnosis Date   HIV (human immunodeficiency virus infection) (HCC)     No past surgical history on file.  No family history on file.    Social History   Socioeconomic History   Marital status: Single    Spouse name: Not on file   Number of children: Not on file   Years of education: Not on file   Highest education level: Not on file  Occupational History   Not on file  Tobacco Use   Smoking status: Former    Types: Cigarettes    Passive exposure: Past   Smokeless tobacco: Never  Vaping Use   Vaping Use: Never used  Substance and Sexual Activity   Alcohol use: Yes    Comment: daily   Drug use: Not Currently   Sexual activity: Not on file  Other Topics Concern   Not on file  Social History Narrative   Not on file   Social Determinants of Health   Financial Resource Strain: Not on file  Food Insecurity: Not on file  Transportation Needs: Not on file  Physical Activity: Not on file  Stress: Not on file  Social Connections: Not on file    No Known Allergies   Current Outpatient Medications:    bictegravir-emtricitabine-tenofovir AF (BIKTARVY) 50-200-25 MG TABS tablet, Take 1 tablet by mouth daily., Disp: 30 tablet, Rfl:    bictegravir-emtricitabine-tenofovir AF (BIKTARVY) 50-200-25 MG TABS tablet, Take 1 tablet by mouth daily for 28 days., Disp: 28 tablet, Rfl: 0   predniSONE (DELTASONE) 10 MG tablet, Take 4 tablets (40  mg total) by mouth daily with breakfast for 3 days, THEN 2 tablets (20 mg total) daily with breakfast for 11 days., Disp: 34 tablet, Rfl: 0   sulfamethoxazole-trimethoprim (BACTRIM DS) 800-160 MG tablet, Take 2 tablets by mouth in the morning, at noon, and at bedtime., Disp: 80 tablet, Rfl: 0   Review of Systems  Constitutional:  Negative for activity change, appetite change, chills, diaphoresis, fatigue, fever and unexpected weight change.  HENT:  Positive for mouth sores. Negative for congestion, rhinorrhea, sinus pressure, sneezing, sore throat and trouble swallowing.   Eyes:  Negative for photophobia and visual disturbance.  Respiratory:  Positive for choking. Negative for cough, chest tightness, shortness of breath, wheezing and stridor.   Cardiovascular:  Negative for chest pain, palpitations and leg swelling.  Gastrointestinal:  Negative for abdominal distention, abdominal pain, anal bleeding, blood in stool, constipation, diarrhea, nausea and vomiting.  Genitourinary:  Negative for difficulty urinating, dysuria, flank pain and hematuria.  Musculoskeletal:  Negative for arthralgias, back pain, gait problem, joint swelling and myalgias.  Skin:  Negative for color change, pallor, rash and wound.  Neurological:  Negative for dizziness, tremors, weakness, light-headedness and headaches.  Hematological:  Negative for adenopathy. Does not bruise/bleed easily.  Psychiatric/Behavioral:  Negative for agitation, behavioral problems, confusion, decreased concentration, dysphoric mood, sleep disturbance and suicidal ideas.  Objective:   Physical Exam Constitutional:      Appearance: He is underweight.  HENT:     Head: Normocephalic and atraumatic.     Mouth/Throat:     Comments: Thrush Eyes:     Conjunctiva/sclera: Conjunctivae normal.  Cardiovascular:     Rate and Rhythm: Normal rate and regular rhythm.     Heart sounds: No murmur heard.   No friction rub. No gallop.  Pulmonary:      Effort: Pulmonary effort is normal. No respiratory distress.     Breath sounds: No stridor. No wheezing or rhonchi.  Abdominal:     General: Abdomen is flat. There is no distension.     Palpations: Abdomen is soft.  Musculoskeletal:        General: No tenderness. Normal range of motion.     Cervical back: Normal range of motion and neck supple.  Skin:    General: Skin is warm and dry.     Coloration: Skin is not pale.     Findings: No erythema or rash.  Neurological:     General: No focal deficit present.     Mental Status: He is alert and oriented to person, place, and time.  Psychiatric:        Mood and Affect: Mood normal.        Behavior: Behavior normal.        Thought Content: Thought content normal.        Judgment: Judgment normal.          Assessment & Plan:  HIV AIDS:  CD4 count at diagnosis was 28 and viral load was 4 and 21,000 labs are reviewed from Jun 19, 2021  I am having trouble seeing his complete genotype in the computer I can see he has no integrase resistance.  He does not have prescription benefits or his insurance will need to be enrolled in H MAP.  We are giving him another month supply of Biktarvy.  Once he completes treatment for PCP pneumonia needs to take Bactrim daily for prophylaxis.  PCP pneumonia he has completed 21-day course of Bactrim and prednisone.  Thrush: Undoubtedly due to antibiotics steroids and low T-cell count.  We will give him fluconazole 14 days we will have to pad a pocket this point time unfortunately.  Vaccine counseling recommended Prevnar hep A and hep B vaccines today which she received.  I spent 62 minutes with the patient including than 50% of the time in face to face counseling of the patient and his brother who accompanied him or the nature of HIV AIDS health viral source affects the immune system how he can control it with highly effective antiretrovirals and restore immune health long life and quality of life to  normal undetectable equals on transmissible personally reviewing [facility] along with review of medical records in preparation for the visit and during the visit and in coordination of his care.

## 2021-07-11 NOTE — Progress Notes (Addendum)
HPI: Kent Page is a 31 y.o. male who presents to the [facility] clinic today to initiate care for a newly diagnosed HIV infection.  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   SULFAMETHOXAZOLE-TRIMETHOPRIM (BACTRIM DS) 800-160 MG TABLET    Take 2 tablets by mouth in the morning, at noon, and at bedtime.  Modified Medications   Modified Medication Previous Medication   BICTEGRAVIR-EMTRICITABINE-TENOFOVIR AF (BIKTARVY) 50-200-25 MG TABS TABLET bictegravir-emtricitabine-tenofovir AF (BIKTARVY) 50-200-25 MG TABS tablet      Take 1 tablet by mouth daily.    Take 1 tablet by mouth daily.   BICTEGRAVIR-EMTRICITABINE-TENOFOVIR AF (BIKTARVY) 50-200-25 MG TABS TABLET bictegravir-emtricitabine-tenofovir AF (BIKTARVY) 50-200-25 MG TABS tablet      Take 1 tablet by mouth daily.    Take 1 tablet by mouth daily for 28 days.   FLUCONAZOLE (DIFLUCAN) 200 MG TABLET fluconazole (DIFLUCAN) 200 MG tablet      Take two tablets by mouth the first day and then take one tablet daily for 13 days    Take two tablets by mouth the first day and then take one tablet daily for 13 days   SULFAMETHOXAZOLE-TRIMETHOPRIM (BACTRIM DS) 800-160 MG TABLET sulfamethoxazole-trimethoprim (BACTRIM DS) 800-160 MG tablet      Take 1 tablet by mouth daily. Start taking once a day AFTER you finish treatment for pneumonia    Take 1 tablet by mouth daily. 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   Thrush 07/11/2021    Social History: Social History   Socioeconomic History   Marital status: Single    Spouse name: Not on file   Number of children: Not on file   Years of education: Not on file   Highest education level: Not on file  Occupational History   Not on file  Tobacco Use   Smoking status: Former    Types: Cigarettes    Passive exposure: Past   Smokeless tobacco: Never  Vaping Use   Vaping Use: Never used  Substance and Sexual Activity   Alcohol use: Yes    Comment: daily   Drug use: Not Currently   Sexual activity: Not on file  Other Topics Concern   Not on file  Social History Narrative   Not on file   Social Determinants of Health   Financial Resource Strain: Not on file  Food Insecurity: Not on file  Transportation Needs: Not on file  Physical Activity: Not on file  Stress: Not on file  Social Connections: Not on file    Labs: Lab Results  Component Value Date   HIV1RNAQUANT  421,000 06/19/2021    RPR and STI Lab Results  Component Value Date   LABRPR NON REACTIVE 06/27/2021   LABRPR NON REACTIVE 06/19/2021        View : No data to display.          Hepatitis B Lab Results  Component Value Date   HEPBSAG NON REACTIVE 06/27/2021   HEPBCAB NON REACTIVE 06/27/2021   Hepatitis C No results found for: HEPCAB, HCVRNAPCRQN Hepatitis A Lab Results  Component Value Date   HAV NON REACTIVE 06/27/2021   Lipids: No results found for: CHOL, TRIG, HDL, CHOLHDL, VLDL, LDLCALC  Current HIV Regimen: Biktarvy  Assessment: Kent Page is here today to initiate care with Dr.  Tommy Medal for his newly diagnosed HIV infection.  Previously met with Dr. Delaine Lame on 5/16 and started on Biktarvy. Initial confusion as patient presents with insurance card today; has medical benefits but no pharmacy coverage. Will sign up for HMAP today. Pending approval, provided 4 weeks Biktarvy samples.  Explained that Kent Page is a one pill once daily medication with or without food and the importance of not missing any doses. Counseled patient to take it around the same time each day. Counseled on what to do if dose is missed, if closer to missed dose take immediately, if closer to next dose then skip and resume normal schedule. I reviewed patient medications and found one possible drug interaction. He would like to take men's multivitamin that he will separate from his [place]. Discussed with patient to call clinic if he starts a new medication or herbal supplement. I gave the patient my card and told him to call me with any issues/questions/concerns.  Patient also needs Bactrim and fluconazole. Will be covered on HMAP once approved. Will pick up 30-day fills out of pocket at [facility] today.   Plan: Counseled patient on Biktarvy Complete HMAP assistance Provided Biktarvy samples x 4 weeks   Alfonse Spruce, PharmD, CPP Clinical Pharmacist Practitioner Infectious Diseases [facility] 07/11/2021, 3:53 PM

## 2021-07-12 ENCOUNTER — Other Ambulatory Visit (HOSPITAL_COMMUNITY): Payer: Self-pay

## 2021-07-12 ENCOUNTER — Other Ambulatory Visit: Payer: Self-pay | Admitting: Pharmacist

## 2021-07-12 DIAGNOSIS — B20 Human immunodeficiency virus [HIV] disease: Secondary | ICD-10-CM

## 2021-07-12 MED ORDER — BICTEGRAVIR-EMTRICITAB-TENOFOV 50-200-25 MG PO TABS: 1.0000 | ORAL_TABLET | Freq: Every day | ORAL | 0 refills | Status: DC

## 2021-07-12 NOTE — Progress Notes (Signed)
Medication Samples have been provided to the patient.  Drug name: Biktarvy        Strength: 50/200/25 mg       Qty: 28 tablets (4 bottles) LOT: CMWKWA   Exp.Date: 9/25  Dosing instructions: Take one tablet by mouth once daily  The patient has been instructed regarding the correct time, dose, and frequency of taking this medication, including desired effects and most common side effects.   Jewelz Ricklefs, PharmD, CPP Clinical Pharmacist Practitioner Infectious Diseases Clinical Pharmacist [facility]  

## 2021-07-18 LAB — CULTURE, FUNGUS WITHOUT SMEAR

## 2021-07-19 LAB — ACID FAST SMEAR (AFB, MYCOBACTERIA): Acid Fast Smear: NEGATIVE

## 2021-08-07 ENCOUNTER — Ambulatory Visit: Payer: No Typology Code available for payment source | Admitting: Infectious Disease

## 2021-08-07 ENCOUNTER — Encounter: Payer: Self-pay | Admitting: Infectious Disease

## 2021-08-07 ENCOUNTER — Other Ambulatory Visit: Payer: Self-pay

## 2021-08-07 VITALS — BP 119/82 | HR 76 | Temp 97.3°F | Ht 66.0 in | Wt 138.0 lb

## 2021-08-07 DIAGNOSIS — B370 Candidal stomatitis: Secondary | ICD-10-CM | POA: Diagnosis not present

## 2021-08-07 DIAGNOSIS — B20 Human immunodeficiency virus [HIV] disease: Secondary | ICD-10-CM | POA: Diagnosis not present

## 2021-08-07 DIAGNOSIS — Z23 Encounter for immunization: Secondary | ICD-10-CM

## 2021-08-07 DIAGNOSIS — B259 Cytomegaloviral disease, unspecified: Secondary | ICD-10-CM | POA: Diagnosis not present

## 2021-08-07 DIAGNOSIS — B59 Pneumocystosis: Secondary | ICD-10-CM | POA: Diagnosis not present

## 2021-08-07 MED ORDER — BICTEGRAVIR-EMTRICITAB-TENOFOV 50-200-25 MG PO TABS: 1.0000 | ORAL_TABLET | Freq: Every day | ORAL | 11 refills | Status: DC

## 2021-08-07 MED ORDER — SULFAMETHOXAZOLE-TRIMETHOPRIM 800-160 MG PO TABS: 1.0000 | ORAL_TABLET | Freq: Every day | ORAL | 7 refills | Status: DC

## 2021-08-08 ENCOUNTER — Telehealth: Payer: Self-pay

## 2021-08-08 DIAGNOSIS — B20 Human immunodeficiency virus [HIV] disease: Secondary | ICD-10-CM

## 2021-08-08 LAB — T-HELPER CELLS (CD4) COUNT (NOT AT ARMC)
CD4 % Helper T Cell: 6 % — ABNORMAL LOW (ref 33–65)
CD4 T Cell Abs: 144 /uL — ABNORMAL LOW (ref 400–1790)

## 2021-08-08 NOTE — Telephone Encounter (Signed)
Left voicemail asking patient to return my call. Also sent my chart message.    Demar Shad Lesli Albee, CMA

## 2021-08-09 NOTE — Addendum Note (Signed)
Addended by: Linna Hoff D on: 08/09/2021 12:03 PM   Modules accepted: Orders

## 2021-08-09 NOTE — Telephone Encounter (Signed)
Spoke with patient, discussed elevated creatinine and advised him to stay hydrated. He will come back 08/20/21 for repeat labs.   He's asking about his viral load and CD4 - relayed that viral load is not back yet, but that his CD4 count has increased greatly. He is very excited about this news and is looking forward to seeing his viral load. Asked him to please call back with any questions or concerns.   Sandie Ano, RN

## 2021-08-10 LAB — ACID FAST CULTURE WITH REFLEXED SENSITIVITIES (MYCOBACTERIA): Acid Fast Culture: NEGATIVE

## 2021-08-11 LAB — CBC WITH DIFFERENTIAL/PLATELET
Absolute Monocytes: 975 cells/uL — ABNORMAL HIGH (ref 200–950)
Basophils Absolute: 69 cells/uL (ref 0–200)
Basophils Relative: 1.3 %
Eosinophils Absolute: 21 cells/uL (ref 15–500)
Eosinophils Relative: 0.4 %
HCT: 36.5 % — ABNORMAL LOW (ref 38.5–50.0)
Hemoglobin: 12.5 g/dL — ABNORMAL LOW (ref 13.2–17.1)
Lymphs Abs: 3164 cells/uL (ref 850–3900)
MCH: 30.6 pg (ref 27.0–33.0)
MCHC: 34.2 g/dL (ref 32.0–36.0)
MCV: 89.5 fL (ref 80.0–100.0)
MPV: 8.5 fL (ref 7.5–12.5)
Monocytes Relative: 18.4 %
Neutro Abs: 1071 cells/uL — ABNORMAL LOW (ref 1500–7800)
Neutrophils Relative %: 20.2 %
Platelets: 396 10*3/uL (ref 140–400)
RBC: 4.08 10*6/uL — ABNORMAL LOW (ref 4.20–5.80)
RDW: 16.6 % — ABNORMAL HIGH (ref 11.0–15.0)
Total Lymphocyte: 59.7 %
WBC: 5.3 10*3/uL (ref 3.8–10.8)

## 2021-08-11 LAB — COMPLETE METABOLIC PANEL WITH GFR
AG Ratio: 1.4 (calc) (ref 1.0–2.5)
ALT: 31 U/L (ref 9–46)
AST: 22 U/L (ref 10–40)
Albumin: 4.2 g/dL (ref 3.6–5.1)
Alkaline phosphatase (APISO): 47 U/L (ref 36–130)
BUN/Creatinine Ratio: 9 (calc) (ref 6–22)
BUN: 13 mg/dL (ref 7–25)
CO2: 26 mmol/L (ref 20–32)
Calcium: 9.4 mg/dL (ref 8.6–10.3)
Chloride: 106 mmol/L (ref 98–110)
Creat: 1.49 mg/dL — ABNORMAL HIGH (ref 0.60–1.26)
Globulin: 2.9 g/dL (calc) (ref 1.9–3.7)
Glucose, Bld: 91 mg/dL (ref 65–99)
Potassium: 4.1 mmol/L (ref 3.5–5.3)
Sodium: 139 mmol/L (ref 135–146)
Total Bilirubin: 0.2 mg/dL (ref 0.2–1.2)
Total Protein: 7.1 g/dL (ref 6.1–8.1)
eGFR: 63 mL/min/{1.73_m2} (ref >=60)

## 2021-08-11 LAB — HIV-1 RNA QUANT-NO REFLEX-BLD
HIV 1 RNA Quant: 689 Copies/mL — ABNORMAL HIGH
HIV-1 RNA Quant, Log: 2.84 Log cps/mL — ABNORMAL HIGH

## 2021-08-20 ENCOUNTER — Other Ambulatory Visit: Payer: Self-pay

## 2021-08-20 DIAGNOSIS — B20 Human immunodeficiency virus [HIV] disease: Secondary | ICD-10-CM

## 2021-08-21 LAB — COMPLETE METABOLIC PANEL WITH GFR
AG Ratio: 1.4 (calc) (ref 1.0–2.5)
ALT: 25 U/L (ref 9–46)
AST: 22 U/L (ref 10–40)
Albumin: 4.1 g/dL (ref 3.6–5.1)
Alkaline phosphatase (APISO): 38 U/L (ref 36–130)
BUN/Creatinine Ratio: 8 (calc) (ref 6–22)
BUN: 11 mg/dL (ref 7–25)
CO2: 24 mmol/L (ref 20–32)
Calcium: 9.2 mg/dL (ref 8.6–10.3)
Chloride: 106 mmol/L (ref 98–110)
Creat: 1.37 mg/dL — ABNORMAL HIGH (ref 0.60–1.26)
Globulin: 2.9 g/dL (calc) (ref 1.9–3.7)
Glucose, Bld: 89 mg/dL (ref 65–99)
Potassium: 4.4 mmol/L (ref 3.5–5.3)
Sodium: 140 mmol/L (ref 135–146)
Total Bilirubin: 0.4 mg/dL (ref 0.2–1.2)
Total Protein: 7 g/dL (ref 6.1–8.1)
eGFR: 70 mL/min/{1.73_m2} (ref >=60)

## 2021-09-26 ENCOUNTER — Encounter: Payer: Self-pay | Admitting: Infectious Disease

## 2021-09-26 ENCOUNTER — Other Ambulatory Visit: Payer: Self-pay

## 2021-09-26 ENCOUNTER — Ambulatory Visit: Payer: Self-pay | Admitting: Infectious Disease

## 2021-09-26 VITALS — BP 117/75 | HR 86 | Resp 16 | Ht 66.0 in | Wt 142.0 lb

## 2021-09-26 DIAGNOSIS — Z23 Encounter for immunization: Secondary | ICD-10-CM

## 2021-09-26 DIAGNOSIS — B20 Human immunodeficiency virus [HIV] disease: Secondary | ICD-10-CM

## 2021-09-26 DIAGNOSIS — B259 Cytomegaloviral disease, unspecified: Secondary | ICD-10-CM

## 2021-09-26 DIAGNOSIS — R21 Rash and other nonspecific skin eruption: Secondary | ICD-10-CM

## 2021-09-26 DIAGNOSIS — Z7185 Encounter for immunization safety counseling: Secondary | ICD-10-CM

## 2021-09-26 HISTORY — DX: Encounter for immunization safety counseling: Z71.85

## 2021-09-26 MED ORDER — BICTEGRAVIR-EMTRICITAB-TENOFOV 50-200-25 MG PO TABS: 1.0000 | ORAL_TABLET | Freq: Every day | ORAL | 11 refills | Status: DC

## 2021-09-26 MED ORDER — SULFAMETHOXAZOLE-TRIMETHOPRIM 800-160 MG PO TABS: 1.0000 | ORAL_TABLET | Freq: Every day | ORAL | 7 refills | Status: DC

## 2021-09-26 NOTE — Progress Notes (Signed)
Subjective:  Chief complaint: Follow-up for HIV AIDS on Biktarvy having concerns about running into difficulty paying for meds also concerns about episodic rash  Patient ID: Kent Page, male    DOB: [birth year], 31 y.o.   MRN: [id number]  HPI  Kent Page is a 31 year old black man recently diagnosed with HIV and AIDS and hospitalized at [facility] with presumed PCP pneumonia.  His PCP has responded nicely to Bactrim and prednisone and Kent Page is now down to 20 mg of prednisone.  Kent Page is developed thrush while on the high-dose steroids antibiotics.  Kent Page is otherwise tolerating Biktarvy without any problems.  I saw him in clinic in May and prescribed him fluconazole for thrush which was apparent.  Since then Kent Page is completed his back trauma and prednisone and now on Bactrim at prophylactic dose is also completed fluconazole.  Kent Page is enrolled in the HIV medication assistance program.  Kent Page prefers filling his Biktarvy at the local pharmacy here in [place] rather than having it shipped to the house where Kent Page lives because does not want his mother to find out about his HIV positive status.  Kent Page told me that Kent Page is having episodic rashes that come and go that seem like mosquito bites.  Kent Page is not sure if they are mosquito bites or if they are rash.  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  No family history on file.    Social History   Socioeconomic History   Marital status: Single    Spouse name: Not on file   Number of children: Not on file   Years of education: Not on file   Highest education level: Not on file  Occupational History   Not on file  Tobacco Use    Smoking status: Former    Types: Cigarettes    Passive exposure: Past   Smokeless tobacco: Never  Vaping Use   Vaping Use: Never used  Substance and Sexual Activity   Alcohol use: Yes    Alcohol/week: 2.0 standard drinks of alcohol    Types: 2 Cans of beer per week    Comment: daily   Drug use: Not Currently    Types: Marijuana    Comment: daily   Sexual activity: Not on file  Other Topics Concern   Not on file  Social History Narrative   Not on file   Social Determinants of Health   Financial Resource Strain: Not on file  Food Insecurity: Not on file  Transportation Needs: Not on file  Physical Activity: Not on file  Stress: Not on file  Social Connections: Not on file    No Known Allergies   Current Outpatient Medications:    bictegravir-emtricitabine-tenofovir AF (BIKTARVY) 50-200-25 MG TABS tablet, Take 1 tablet by mouth daily., Disp: 30 tablet, Rfl: 11   fluconazole (DIFLUCAN) 200 MG tablet, Take two tablets by mouth the first day and then take one tablet daily for 13 days (Patient not taking: Reported on 09/26/2021), Disp: 15 tablet, Rfl: 2   sulfamethoxazole-trimethoprim (BACTRIM DS) 800-160 MG tablet, Take 1 tablet by mouth daily. (Patient not taking: Reported on 09/26/2021), Disp: 30 tablet, Rfl: 7   Review of Systems  Constitutional:  Negative for activity change, appetite change, chills, diaphoresis, fatigue, fever and unexpected weight change.  HENT:  Negative for congestion, rhinorrhea, sinus pressure, sneezing, sore throat and trouble swallowing.   Eyes:  Negative for photophobia and visual disturbance.  Respiratory:  Negative for cough, chest tightness, shortness of breath, wheezing and stridor.   Cardiovascular:  Negative for chest pain, palpitations and leg swelling.  Gastrointestinal:  Negative for abdominal distention, abdominal pain, anal bleeding, blood in stool, constipation, diarrhea, nausea and vomiting.  Genitourinary:  Negative for difficulty  urinating, dysuria, flank pain and hematuria.  Musculoskeletal:  Negative for arthralgias, back pain, gait problem, joint swelling and myalgias.  Skin:  Negative for color change, pallor, rash and wound.  Neurological:  Negative for dizziness, tremors, weakness and light-headedness.  Hematological:  Negative for adenopathy. Does not bruise/bleed easily.  Psychiatric/Behavioral:  Negative for agitation, behavioral problems, confusion, decreased concentration, dysphoric mood and sleep disturbance.        Objective:   Physical Exam Constitutional:      Appearance: Kent Page is well-developed.  HENT:     Head: Normocephalic and atraumatic.  Eyes:     Conjunctiva/sclera: Conjunctivae normal.  Cardiovascular:     Rate and Rhythm: Normal rate and regular rhythm.  Pulmonary:     Effort: Pulmonary effort is normal. No respiratory distress.     Breath sounds: No wheezing.  Abdominal:     General: There is no distension.     Palpations: Abdomen is soft.  Musculoskeletal:        General: No tenderness. Normal range of motion.     Cervical back: Normal range of motion and neck supple.  Skin:    General: Skin is warm and dry.     Coloration: Skin is not pale.     Findings: No erythema or rash.  Neurological:     General: No focal deficit present.     Mental Status: Kent Page is alert and oriented to person, place, and time.  Psychiatric:        Mood and Affect: Mood normal.        Behavior: Behavior normal.        Thought Content: Thought content normal.        Judgment: Judgment normal.           Assessment & Plan:  HIV and AIDS:  I am rechecking a viral load and CD4 count  And continue his prescription for Biktarvy and Bactrim.  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

## 2021-09-26 NOTE — Addendum Note (Signed)
Addended by: Clayborne Artist A on: 09/26/2021 04:24 PM   Modules accepted: Orders

## 2021-10-01 ENCOUNTER — Telehealth: Payer: Self-pay

## 2021-10-01 NOTE — Telephone Encounter (Signed)
Dolphus returned call, he will come in tomorrow for labs.   Sandie Ano, RN

## 2021-10-01 NOTE — Telephone Encounter (Signed)
Patient had appointment 8/16, but labs were not done. Called [facility] to schedule lab appointment for viral load and CD4 count, no answer. Left HIPAA compliant voicemail requesting callback.   Sandie Ano, RN

## 2021-10-02 ENCOUNTER — Other Ambulatory Visit: Payer: Self-pay

## 2021-10-02 DIAGNOSIS — B20 Human immunodeficiency virus [HIV] disease: Secondary | ICD-10-CM

## 2021-10-03 LAB — T-HELPER CELLS (CD4) COUNT (NOT AT ARMC)
CD4 % Helper T Cell: 6 % — ABNORMAL LOW (ref 33–65)
CD4 T Cell Abs: 124 /uL — ABNORMAL LOW (ref 400–1790)

## 2021-10-04 ENCOUNTER — Ambulatory Visit: Payer: No Typology Code available for payment source | Admitting: Infectious Disease

## 2021-10-04 LAB — HIV-1 RNA QUANT-NO REFLEX-BLD
HIV 1 RNA Quant: 74 Copies/mL — ABNORMAL HIGH
HIV-1 RNA Quant, Log: 1.87 Log cps/mL — ABNORMAL HIGH

## 2021-10-17 ENCOUNTER — Telehealth: Payer: Self-pay

## 2021-10-17 NOTE — Telephone Encounter (Signed)
Patient called wanting to discuss results from 8/22. We discussed that his viral load is trending down which is exactly what we want to see.   His CD4 count is slightly lower than the previous value, but we discussed that many factors can contribute to his CD4 count and encouraged continued adherence to his medication.   We also talked about the fact that his CD4 count was only 28 when he was first diagnosed and is up to 124. Patient verbalized understanding and has no further questions.   Sandie Ano, RN

## 2022-02-26 NOTE — Progress Notes (Signed)
Subjective:  Chief complaint: followup for HIV disease on medications   Patient ID: Kent Page, male    DOB: [birth year], 32 y.o.   MRN: [id number]  Kent Page is a 32 year-old black man recently diagnosed with HIV and AIDS and hospitalized at [facility] hospital with presumed PCP pneumonia.  His PCP has responded nicely to Bactrim and prednisone and he is now down to 20 mg of prednisone.  He is developed thrush while on the high-dose steroids antibiotics.  He is otherwise tolerating Biktarvy without any problems.  I saw him in clinic in May and prescribed him fluconazole for thrush which was apparent.  Since then he is completed his back trauma and prednisone and now on Bactrim at prophylactic dose is also completed fluconazole.  He is enrolled in the HIV medication assistance program.  He prefers filling his Biktarvy at the local pharmacy here in [place] rather than having it shipped to the house where he lives because does not want his mother to find out about his HIV positive status.  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  No family history on file.    Social History   Socioeconomic History   Marital status: Single    Spouse name: Not on file   Number of children: Not on file   Years of education: Not on file   Highest education level: Not on file  Occupational History   Not on file  Tobacco Use   Smoking status: Former    Types: Cigarettes    Passive exposure: Past   Smokeless tobacco: Never  Vaping Use   Vaping Use: Never used  Substance and Sexual Activity   Alcohol  use: Yes    Alcohol/week: 2.0 standard drinks of alcohol    Types: 2 Cans of beer per week    Comment: daily   Drug use: Not Currently    Types: Marijuana    Comment: daily   Sexual activity: Not on file  Other Topics Concern   Not on file  Social History Narrative   Not on file   Social Determinants of Health   Financial Resource Strain: Not on file  Food Insecurity: Not on file  Transportation Needs: Not on file  Physical Activity: Not on file  Stress: Not on file  Social Connections: Not on file    No Known Allergies   Current Outpatient Medications:    bictegravir-emtricitabine-tenofovir AF (BIKTARVY) 50-200-25 MG TABS tablet, Take 1 tablet by mouth daily., Disp: 30 tablet, Rfl: 11   sulfamethoxazole-trimethoprim (BACTRIM DS) 800-160 MG tablet, Take 1 tablet by mouth daily., Disp: 30 tablet, Rfl: 7   Review of Systems  Constitutional:  Negative for activity change, appetite change, chills, diaphoresis, fatigue, fever and unexpected weight change.  HENT:  Negative for congestion, rhinorrhea, sinus pressure, sneezing, sore throat and trouble swallowing.   Eyes:  Negative for photophobia and visual disturbance.  Respiratory:  Negative for cough, chest tightness, shortness of breath, wheezing and stridor.   Cardiovascular:  Negative for chest pain, palpitations and leg swelling.  Gastrointestinal:  Negative for  abdominal distention, abdominal pain, anal bleeding, blood in stool, constipation, diarrhea, nausea and vomiting.  Genitourinary:  Negative for difficulty urinating, dysuria, flank pain and hematuria.  Musculoskeletal:  Negative for arthralgias, back pain, gait problem, joint swelling and myalgias.  Skin:  Negative for color change, pallor, rash and wound.  Neurological:  Negative for dizziness, tremors, weakness and light-headedness.  Hematological:  Negative for adenopathy. Does not bruise/bleed easily.  Psychiatric/Behavioral:  Negative for agitation, behavioral  problems, confusion, decreased concentration, dysphoric mood and sleep disturbance.        Objective:   Physical Exam Constitutional:      Appearance: He is well-developed.  HENT:     Head: Normocephalic and atraumatic.  Eyes:     Conjunctiva/sclera: Conjunctivae normal.  Cardiovascular:     Rate and Rhythm: Normal rate and regular rhythm.  Pulmonary:     Effort: Pulmonary effort is normal. No respiratory distress.     Breath sounds: No wheezing.  Abdominal:     General: There is no distension.     Palpations: Abdomen is soft.  Musculoskeletal:        General: No tenderness. Normal range of motion.     Cervical back: Normal range of motion and neck supple.  Skin:    General: Skin is warm and dry.     Coloration: Skin is not pale.     Findings: No erythema or rash.  Neurological:     General: No focal deficit present.     Mental Status: He is alert and oriented to person, place, and time.  Psychiatric:        Mood and Affect: Mood normal.        Behavior: Behavior normal.        Thought Content: Thought content normal.        Judgment: Judgment normal.           Assessment & Plan:  HIV disease:  I will add order HIV viral load CD4 count CBC with differential CMP, RPR GC and chlamydia and I will continue  [place] and Bactrim DS prescriptions  PCP pneumonia:continue PCP prevention with Bactrim   Vaccine counseling: recommended updated COVID 19, annual flu and hepatitis A vaccine.  He also does need a tetanus vaccine but I think he can wait till either the next visit or to get through pharmacy.

## 2022-02-27 ENCOUNTER — Other Ambulatory Visit: Payer: Self-pay

## 2022-02-27 ENCOUNTER — Ambulatory Visit: Payer: Self-pay | Admitting: Infectious Disease

## 2022-02-27 ENCOUNTER — Ambulatory Visit (INDEPENDENT_AMBULATORY_CARE_PROVIDER_SITE_OTHER): Payer: Self-pay

## 2022-02-27 ENCOUNTER — Encounter: Payer: Self-pay | Admitting: Infectious Disease

## 2022-02-27 VITALS — BP 137/83 | HR 83 | Temp 97.6°F | Ht 66.0 in | Wt 147.0 lb

## 2022-02-27 DIAGNOSIS — B59 Pneumocystosis: Secondary | ICD-10-CM

## 2022-02-27 DIAGNOSIS — Z23 Encounter for immunization: Secondary | ICD-10-CM

## 2022-02-27 DIAGNOSIS — B20 Human immunodeficiency virus [HIV] disease: Secondary | ICD-10-CM

## 2022-02-27 DIAGNOSIS — Z7185 Encounter for immunization safety counseling: Secondary | ICD-10-CM

## 2022-02-27 MED ORDER — BICTEGRAVIR-EMTRICITAB-TENOFOV 50-200-25 MG PO TABS: 1.0000 | ORAL_TABLET | Freq: Every day | ORAL | 11 refills | Status: DC

## 2022-02-27 MED ORDER — SULFAMETHOXAZOLE-TRIMETHOPRIM 800-160 MG PO TABS: 1.0000 | ORAL_TABLET | Freq: Every day | ORAL | 7 refills | Status: DC

## 2022-02-27 NOTE — Addendum Note (Signed)
Addended by: Lin Landsman E on: 02/27/2022 01:05 PM   Modules accepted: Orders

## 2022-02-28 LAB — URINE CYTOLOGY ANCILLARY ONLY
Chlamydia: NEGATIVE
Comment: NEGATIVE
Comment: NORMAL
Neisseria Gonorrhea: NEGATIVE

## 2022-02-28 LAB — T-HELPER CELLS (CD4) COUNT (NOT AT ARMC)
CD4 % Helper T Cell: 9 % — ABNORMAL LOW (ref 33–65)
CD4 T Cell Abs: 124 /uL — ABNORMAL LOW (ref 400–1790)

## 2022-03-02 LAB — LIPID PANEL
Cholesterol: 237 mg/dL — ABNORMAL HIGH (ref <200)
HDL: 75 mg/dL (ref >=40)
LDL Cholesterol (Calc): 138 mg/dL (calc) — ABNORMAL HIGH
Non-HDL Cholesterol (Calc): 162 mg/dL (calc) — ABNORMAL HIGH (ref <130)
Total CHOL/HDL Ratio: 3.2 (calc) (ref <5.0)
Triglycerides: 120 mg/dL (ref <150)

## 2022-03-02 LAB — CBC WITH DIFFERENTIAL/PLATELET
Absolute Monocytes: 404 cells/uL (ref 200–950)
Basophils Absolute: 20 cells/uL (ref 0–200)
Basophils Relative: 0.5 %
Eosinophils Absolute: 172 cells/uL (ref 15–500)
Eosinophils Relative: 4.3 %
HCT: 45.1 % (ref 38.5–50.0)
Hemoglobin: 15.4 g/dL (ref 13.2–17.1)
Lymphs Abs: 1660 cells/uL (ref 850–3900)
MCH: 32.3 pg (ref 27.0–33.0)
MCHC: 34.1 g/dL (ref 32.0–36.0)
MCV: 94.5 fL (ref 80.0–100.0)
MPV: 9.1 fL (ref 7.5–12.5)
Monocytes Relative: 10.1 %
Neutro Abs: 1744 cells/uL (ref 1500–7800)
Neutrophils Relative %: 43.6 %
Platelets: 293 10*3/uL (ref 140–400)
RBC: 4.77 10*6/uL (ref 4.20–5.80)
RDW: 12.7 % (ref 11.0–15.0)
Total Lymphocyte: 41.5 %
WBC: 4 10*3/uL (ref 3.8–10.8)

## 2022-03-02 LAB — COMPLETE METABOLIC PANEL WITH GFR
AG Ratio: 1.6 (calc) (ref 1.0–2.5)
ALT: 22 U/L (ref 9–46)
AST: 20 U/L (ref 10–40)
Albumin: 5.1 g/dL (ref 3.6–5.1)
Alkaline phosphatase (APISO): 62 U/L (ref 36–130)
BUN/Creatinine Ratio: 11 (calc) (ref 6–22)
BUN: 15 mg/dL (ref 7–25)
CO2: 30 mmol/L (ref 20–32)
Calcium: 10 mg/dL (ref 8.6–10.3)
Chloride: 101 mmol/L (ref 98–110)
Creat: 1.36 mg/dL — ABNORMAL HIGH (ref 0.60–1.26)
Globulin: 3.2 g/dL (calc) (ref 1.9–3.7)
Glucose, Bld: 88 mg/dL (ref 65–99)
Potassium: 3.7 mmol/L (ref 3.5–5.3)
Sodium: 139 mmol/L (ref 135–146)
Total Bilirubin: 1.6 mg/dL — ABNORMAL HIGH (ref 0.2–1.2)
Total Protein: 8.3 g/dL — ABNORMAL HIGH (ref 6.1–8.1)
eGFR: 70 mL/min/{1.73_m2} (ref >=60)

## 2022-03-02 LAB — HIV-1 RNA QUANT-NO REFLEX-BLD
HIV 1 RNA Quant: 74 Copies/mL — ABNORMAL HIGH
HIV-1 RNA Quant, Log: 1.87 Log cps/mL — ABNORMAL HIGH

## 2022-03-02 LAB — HEPATITIS C AB W/RFL RNA, PCR + GENO: Hepatitis C Ab: NONREACTIVE

## 2022-03-02 LAB — RPR: RPR Ser Ql: NONREACTIVE

## 2022-03-12 ENCOUNTER — Encounter: Payer: Self-pay | Admitting: Infectious Disease

## 2022-05-03 ENCOUNTER — Telehealth: Payer: Self-pay

## 2022-05-03 NOTE — Telephone Encounter (Signed)
Patient called office to ask if he is supposed to be on Bactrim DS along with his Biktarvy. States pharmacy had only filled [facility]. Per last note patient is supposed to be on both medications. Relayed this to him and requested he call pharmacy for refill. Leatrice Jewels, RMA

## 2022-06-02 NOTE — Progress Notes (Deleted)
   Subjective:  Chief complaint: Patient ID: Kent Page, male    DOB: 11/15/1990, 32 y.o.   MRN: [id number]  HPI  Kent Page is a 32 year-old black man recently diagnosed with HIV and AIDS and hospitalized at [facility] hospital with presumed PCP pneumonia.  His PCP has responded nicely to Bactrim and prednisone and he is now down to 20 mg of prednisone.  He is developed thrush while on the high-dose steroids antibiotics.  He is otherwise tolerating Biktarvy without any problems.  I saw him in clinic in May and prescribed him fluconazole for thrush which was apparent.   He is enrolled in the HIV medication assistance program.  He prefers filling his Biktarvy at the local pharmacy here in [place] rather than having it shipped to the house where he lives because does not want his mother to find out about his HIV positive status.      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  No family history on file.    Social History   Socioeconomic History   Marital status: Single    Spouse name: Not on file   Number of children: Not on file   Years of education: Not on file   Highest education level: Not on file  Occupational History   Not on file  Tobacco Use   Smoking status: Former    Types: Cigarettes    Passive exposure: Past   Smokeless tobacco: Never  Vaping Use   Vaping Use: Never used  Substance and Sexual Activity   Alcohol use: Yes    Alcohol/week: 2.0 standard drinks of alcohol    Types: 2 Cans of beer per week    Comment: daily   Drug use: Yes    Types: Marijuana    Comment: daily   Sexual activity: Not on file    Comment: declined condoms  Other Topics Concern   Not on file  Social History Narrative    Not on file   Social Determinants of Health   Financial Resource Strain: Not on file  Food Insecurity: Not on file  Transportation Needs: Not on file  Physical Activity: Not on file  Stress: Not on file  Social Connections: Not on file    No Known Allergies   Current Outpatient Medications:    bictegravir-emtricitabine-tenofovir AF (BIKTARVY) 50-200-25 MG TABS tablet, Take 1 tablet by mouth daily., Disp: 30 tablet, Rfl: 11   sulfamethoxazole-trimethoprim (BACTRIM DS) 800-160 MG tablet, Take 1 tablet by mouth daily., Disp: 30 tablet, Rfl: 7   Review of Systems     Objective:   Physical Exam        Assessment & Plan:

## 2022-06-03 ENCOUNTER — Ambulatory Visit: Payer: Self-pay | Admitting: Infectious Disease

## 2022-06-11 ENCOUNTER — Encounter: Payer: Self-pay | Admitting: Infectious Disease

## 2022-06-11 ENCOUNTER — Other Ambulatory Visit: Payer: Self-pay

## 2022-06-11 ENCOUNTER — Ambulatory Visit (INDEPENDENT_AMBULATORY_CARE_PROVIDER_SITE_OTHER): Payer: Self-pay | Admitting: Infectious Disease

## 2022-06-11 VITALS — BP 102/72 | HR 70 | Temp 97.5°F | Wt 142.0 lb

## 2022-06-11 DIAGNOSIS — B20 Human immunodeficiency virus [HIV] disease: Secondary | ICD-10-CM

## 2022-06-11 DIAGNOSIS — B370 Candidal stomatitis: Secondary | ICD-10-CM

## 2022-06-11 DIAGNOSIS — B59 Pneumocystosis: Secondary | ICD-10-CM

## 2022-06-11 DIAGNOSIS — B259 Cytomegaloviral disease, unspecified: Secondary | ICD-10-CM

## 2022-06-11 DIAGNOSIS — Z7185 Encounter for immunization safety counseling: Secondary | ICD-10-CM

## 2022-06-11 LAB — CBC WITH DIFFERENTIAL/PLATELET
MPV: 9.4 fL (ref 7.5–12.5)
Monocytes Relative: 11.8 %
Neutro Abs: 3085 cells/uL (ref 1500–7800)

## 2022-06-11 MED ORDER — BICTEGRAVIR-EMTRICITAB-TENOFOV 50-200-25 MG PO TABS: 1.0000 | ORAL_TABLET | Freq: Every day | ORAL | 11 refills | Status: DC

## 2022-06-11 NOTE — Progress Notes (Signed)
Subjective:  Chief complaint:follouwup for HIV disease on Biktarvy   Patient ID: Kent Page, male    DOB: 10/29/1990, 32 y.o.   MRN: [id number]  HPI  Kent Page is a 32 year-old black man recently diagnosed with HIV and AIDS and hospitalized at [facility] hospital with presumed PCP pneumonia.  His PCP has responded nicely to Bactrim and prednisone and he is now down to 20 mg of prednisone.  He is developed thrush while on the high-dose steroids antibiotics.  Heotherwise tolerated Biktarvy without any problems.  I saw him in clinic in May 2023 and prescribed him fluconazole for thrush which was apparent.   He is enrolled in the HIV medication assistance program.  He prefers filling his Biktarvy at the local pharmacy here in [place] rather than having it shipped to the house where he lives because does not want his mother to find out about his HIV positive status.  HMAP is active until September  He is concerned that his CD4 count is taking time to get up above 200 and he wonders if his ingestion of beer which he TIVICAY has 2-3 coronas per day could be affecting this.      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  No family history on file.    Social History   Socioeconomic History   Marital status: Single    Spouse name: Not on file   Number of children: Not on file   Years of education: Not on file   Highest education level: Not on file  Occupational History   Not on file  Tobacco Use   Smoking status: Former    Types: Cigarettes    Passive exposure: Past   Smokeless tobacco: Never  Vaping Use   Vaping Use: Never used  Substance and Sexual Activity   Alcohol use: Yes    Alcohol/week: 2.0 standard drinks  of alcohol    Types: 2 Cans of beer per week    Comment: daily   Drug use: Yes    Types: Marijuana    Comment: daily   Sexual activity: Not on file    Comment: declined condoms  Other Topics Concern   Not on file  Social History Narrative   Not on file   Social Determinants of Health   Financial Resource Strain: Not on file  Food Insecurity: Not on file  Transportation Needs: Not on file  Physical Activity: Not on file  Stress: Not on file  Social Connections: Not on file    No Known Allergies   Current Outpatient Medications:    bictegravir-emtricitabine-tenofovir AF (BIKTARVY) 50-200-25 MG TABS tablet, Take 1 tablet by mouth daily., Disp: 30 tablet, Rfl: 11   sulfamethoxazole-trimethoprim (BACTRIM DS) 800-160 MG tablet, Take 1 tablet by mouth daily., Disp: 30 tablet, Rfl: 7   Review of Systems  Constitutional:  Negative for activity change, appetite change, chills, diaphoresis, fatigue, fever and unexpected weight change.  HENT:  Negative for congestion, rhinorrhea, sinus pressure, sneezing, sore throat and trouble swallowing.   Eyes:  Negative for photophobia and visual disturbance.  Respiratory:  Negative for cough, chest tightness, shortness of breath, wheezing and stridor.   Cardiovascular:  Negative for chest pain, palpitations and leg swelling.  Gastrointestinal:  Negative for  abdominal distention, abdominal pain, anal bleeding, blood in stool, constipation, diarrhea, nausea and vomiting.  Genitourinary:  Negative for difficulty urinating, dysuria, flank pain and hematuria.  Musculoskeletal:  Negative for arthralgias, back pain, gait problem, joint swelling and myalgias.  Skin:  Negative for color change, pallor, rash and wound.  Neurological:  Negative for dizziness, tremors, weakness, light-headedness and headaches.  Hematological:  Negative for adenopathy. Does not bruise/bleed easily.  Psychiatric/Behavioral:  Negative for agitation, behavioral problems,  confusion, decreased concentration, dysphoric mood, sleep disturbance and suicidal ideas.        Objective:   Physical Exam Constitutional:      General: He is not in acute distress.    Appearance: Normal appearance. He is well-developed. He is not ill-appearing or diaphoretic.  HENT:     Head: Normocephalic and atraumatic.     Right Ear: Hearing and external ear normal.     Left Ear: Hearing and external ear normal.     Nose: No nasal deformity or rhinorrhea.  Eyes:     General: No scleral icterus.    Conjunctiva/sclera: Conjunctivae normal.     Right eye: Right conjunctiva is not injected.     Left eye: Left conjunctiva is not injected.     Pupils: Pupils are equal, round, and reactive to light.  Neck:     Vascular: No JVD.  Cardiovascular:     Rate and Rhythm: Normal rate and regular rhythm.     Heart sounds: S1 normal and S2 normal.  Pulmonary:     Effort: Pulmonary effort is normal. No respiratory distress.     Breath sounds: No wheezing.  Abdominal:     General: Bowel sounds are normal. There is no distension.     Palpations: Abdomen is soft.     Tenderness: There is no abdominal tenderness.  Musculoskeletal:        General: No tenderness. Normal range of motion.     Right shoulder: Normal.     Left shoulder: Normal.     Cervical back: Normal range of motion and neck supple.     Right hip: Normal.     Left hip: Normal.     Right knee: Normal.     Left knee: Normal.  Lymphadenopathy:     Head:     Right side of head: No submandibular, preauricular or posterior auricular adenopathy.     Left side of head: No submandibular, preauricular or posterior auricular adenopathy.     Cervical: No cervical adenopathy.     Right cervical: No superficial or deep cervical adenopathy.    Left cervical: No superficial or deep cervical adenopathy.  Skin:    General: Skin is warm and dry.     Coloration: Skin is not pale.     Findings: No abrasion, bruising, ecchymosis, erythema,  lesion or rash.     Nails: There is no clubbing.  Neurological:     General: No focal deficit present.     Mental Status: He is alert and oriented to person, place, and time.     Sensory: No sensory deficit.     Coordination: Coordination normal.     Gait: Gait normal.  Psychiatric:        Attention and Perception: He is attentive.        Mood and Affect: Mood normal.        Speech: Speech normal.        Behavior: Behavior normal. Behavior is cooperative.  Thought Content: Thought content normal.        Judgment: Judgment normal.           Assessment & Plan:    HIV disease:  I will add order HIV viral load CD4 count CBC with differential CMP, RPR GC and chlamydia and I will continue  [facility],  prescription  We discussed the idea of adding [place] but he did not yet want to start that  Continuing his Bactrim for PCP prophylaxis.  Alcohol use: I am skeptical that 2 beers per day if this is truly the amount he drinks is causing him to have a low CD4 count. Certainly if he was cirrhotic and pancytopenic that could cause low WBC  CMV viremia resolved  Vaccine counseling: appeared up to date on vacciens

## 2022-06-12 ENCOUNTER — Telehealth: Payer: Self-pay

## 2022-06-12 DIAGNOSIS — B20 Human immunodeficiency virus [HIV] disease: Secondary | ICD-10-CM

## 2022-06-12 LAB — URINE CYTOLOGY ANCILLARY ONLY
Chlamydia: NEGATIVE
Comment: NEGATIVE
Comment: NORMAL
Neisseria Gonorrhea: NEGATIVE

## 2022-06-12 LAB — COMPLETE METABOLIC PANEL WITH GFR
ALT: 26 U/L (ref 9–46)
Albumin: 4.8 g/dL (ref 3.6–5.1)
Alkaline phosphatase (APISO): 65 U/L (ref 36–130)
BUN/Creatinine Ratio: 10 (calc) (ref 6–22)
BUN: 16 mg/dL (ref 7–25)
Calcium: 9.2 mg/dL (ref 8.6–10.3)
Chloride: 103 mmol/L (ref 98–110)
Sodium: 137 mmol/L (ref 135–146)

## 2022-06-12 LAB — LIPID PANEL
Cholesterol: 187 mg/dL (ref <200)
HDL: 64 mg/dL (ref >=40)
Triglycerides: 80 mg/dL (ref <150)

## 2022-06-12 LAB — T-HELPER CELLS (CD4) COUNT (NOT AT ARMC)
CD4 % Helper T Cell: 9 % — ABNORMAL LOW (ref 33–65)
CD4 T Cell Abs: 169 /uL — ABNORMAL LOW (ref 400–1790)

## 2022-06-12 NOTE — Telephone Encounter (Signed)
Patient aware of results and scheduled for repeat BMP on 5/9.    Xavia Kniskern Lesli Albee, CMA

## 2022-06-12 NOTE — Telephone Encounter (Signed)
-----   Message from Randall Hiss, MD sent at 06/12/2022  9:02 AM EDT ----- Renal fxn worse in him as well. Would have him hydrate and have repeat BMP in 1 week ----- Message ----- From: Interface, [facility] Lab Results In Sent: 06/11/2022   3:36 PM EDT To: Randall Hiss, MD

## 2022-06-12 NOTE — Telephone Encounter (Signed)
-----   Message from Cornelius N Van Dam, MD sent at 06/12/2022  9:02 AM EDT ----- Renal fxn worse in him as well. Would have him hydrate and have repeat BMP in 1 week ----- Message ----- From: Interface, [facility] Lab Results In Sent: 06/11/2022   3:36 PM EDT To: Cornelius N Van Dam, MD   

## 2022-06-14 LAB — COMPLETE METABOLIC PANEL WITH GFR
AG Ratio: 1.8 (calc) (ref 1.0–2.5)
AST: 22 U/L (ref 10–40)
CO2: 25 mmol/L (ref 20–32)
Creat: 1.57 mg/dL — ABNORMAL HIGH (ref 0.60–1.26)
Globulin: 2.7 g/dL (calc) (ref 1.9–3.7)
Glucose, Bld: 86 mg/dL (ref 65–99)
Potassium: 4 mmol/L (ref 3.5–5.3)
Total Bilirubin: 0.9 mg/dL (ref 0.2–1.2)
Total Protein: 7.5 g/dL (ref 6.1–8.1)
eGFR: 59 mL/min/{1.73_m2} — ABNORMAL LOW (ref >=60)

## 2022-06-14 LAB — CBC WITH DIFFERENTIAL/PLATELET
Absolute Monocytes: 755 cells/uL (ref 200–950)
Basophils Absolute: 32 cells/uL (ref 0–200)
Basophils Relative: 0.5 %
Eosinophils Absolute: 230 cells/uL (ref 15–500)
Eosinophils Relative: 3.6 %
HCT: 41.8 % (ref 38.5–50.0)
Hemoglobin: 14 g/dL (ref 13.2–17.1)
Lymphs Abs: 2298 cells/uL (ref 850–3900)
MCH: 31.3 pg (ref 27.0–33.0)
MCHC: 33.5 g/dL (ref 32.0–36.0)
MCV: 93.3 fL (ref 80.0–100.0)
Neutrophils Relative %: 48.2 %
Platelets: 302 10*3/uL (ref 140–400)
RBC: 4.48 10*6/uL (ref 4.20–5.80)
RDW: 13.2 % (ref 11.0–15.0)
Total Lymphocyte: 35.9 %
WBC: 6.4 10*3/uL (ref 3.8–10.8)

## 2022-06-14 LAB — HIV-1 RNA QUANT-NO REFLEX-BLD
HIV 1 RNA Quant: 53 Copies/mL — ABNORMAL HIGH
HIV-1 RNA Quant, Log: 1.72 Log cps/mL — ABNORMAL HIGH

## 2022-06-14 LAB — LIPID PANEL
LDL Cholesterol (Calc): 106 mg/dL (calc) — ABNORMAL HIGH
Non-HDL Cholesterol (Calc): 123 mg/dL (calc) (ref <130)
Total CHOL/HDL Ratio: 2.9 (calc) (ref <5.0)

## 2022-06-14 LAB — RPR: RPR Ser Ql: NONREACTIVE

## 2022-06-17 NOTE — Progress Notes (Signed)
Advised of VL result will follow up in July. Also keeping lab visit fir 06/20/22 to repeat BMP after hydrating.

## 2022-06-20 ENCOUNTER — Other Ambulatory Visit: Payer: Self-pay

## 2022-06-20 DIAGNOSIS — B20 Human immunodeficiency virus [HIV] disease: Secondary | ICD-10-CM

## 2022-06-21 LAB — BASIC METABOLIC PANEL
BUN/Creatinine Ratio: 7 (calc) (ref 6–22)
BUN: 9 mg/dL (ref 7–25)
CO2: 26 mmol/L (ref 20–32)
Calcium: 8.9 mg/dL (ref 8.6–10.3)
Chloride: 105 mmol/L (ref 98–110)
Creat: 1.33 mg/dL — ABNORMAL HIGH (ref 0.60–1.26)
Glucose, Bld: 95 mg/dL (ref 65–99)
Potassium: 4.3 mmol/L (ref 3.5–5.3)
Sodium: 137 mmol/L (ref 135–146)

## 2022-08-08 ENCOUNTER — Ambulatory Visit: Payer: No Typology Code available for payment source | Admitting: Infectious Disease

## 2022-08-13 ENCOUNTER — Ambulatory Visit: Payer: Medicaid Other | Admitting: Infectious Disease

## 2022-09-11 ENCOUNTER — Encounter: Payer: Self-pay | Admitting: Infectious Disease

## 2022-09-11 ENCOUNTER — Other Ambulatory Visit (HOSPITAL_COMMUNITY)
Admission: RE | Admit: 2022-09-11 | Discharge: 2022-09-11 | Disposition: A | Discharge status: Home / Self Care | Payer: Medicaid Other | Source: Ambulatory Visit | Attending: Infectious Disease | Admitting: Infectious Disease

## 2022-09-11 ENCOUNTER — Ambulatory Visit: Payer: Medicaid Other | Admitting: Infectious Disease

## 2022-09-11 ENCOUNTER — Other Ambulatory Visit: Payer: Self-pay

## 2022-09-11 VITALS — BP 99/65 | HR 66 | Resp 16 | Ht 66.0 in | Wt 142.0 lb

## 2022-09-11 DIAGNOSIS — B259 Cytomegaloviral disease, unspecified: Secondary | ICD-10-CM

## 2022-09-11 DIAGNOSIS — B20 Human immunodeficiency virus [HIV] disease: Secondary | ICD-10-CM

## 2022-09-11 DIAGNOSIS — B59 Pneumocystosis: Secondary | ICD-10-CM

## 2022-09-11 LAB — CBC WITH DIFFERENTIAL/PLATELET
Absolute Monocytes: 427 cells/uL (ref 200–950)
Basophils Absolute: 22 cells/uL (ref 0–200)
Basophils Relative: 0.4 %
Eosinophils Absolute: 108 cells/uL (ref 15–500)
Eosinophils Relative: 2 %
HCT: 43.1 % (ref 38.5–50.0)
Hemoglobin: 14.8 g/dL (ref 13.2–17.1)
Lymphs Abs: 2392 cells/uL (ref 850–3900)
MCH: 32.2 pg (ref 27.0–33.0)
MCHC: 34.3 g/dL (ref 32.0–36.0)
MCV: 93.9 fL (ref 80.0–100.0)
MPV: 9.3 fL (ref 7.5–12.5)
Monocytes Relative: 7.9 %
Neutro Abs: 2452 cells/uL (ref 1500–7800)
Neutrophils Relative %: 45.4 %
Platelets: 388 10*3/uL (ref 140–400)
RBC: 4.59 10*6/uL (ref 4.20–5.80)
RDW: 12.3 % (ref 11.0–15.0)
Total Lymphocyte: 44.3 %
WBC: 5.4 10*3/uL (ref 3.8–10.8)

## 2022-09-11 MED ORDER — BICTEGRAVIR-EMTRICITAB-TENOFOV 50-200-25 MG PO TABS
1.0000 | ORAL_TABLET | Freq: Every day | ORAL | 11 refills | Status: DC
Start: 2022-09-11 — End: 2022-12-17

## 2022-09-11 MED ORDER — SULFAMETHOXAZOLE-TRIMETHOPRIM 800-160 MG PO TABS: 1.0000 | ORAL_TABLET | ORAL | 4 refills | Status: DC

## 2022-09-11 NOTE — Progress Notes (Signed)
Subjective:  Chief complaint:follouwup for HIV disease on medications   Patient ID: Kent Page, male    DOB: 12/07/1990, 32 y.o.   MRN: [id number]  HPI  Kent Page is a 32 year-old black man recently diagnosed with HIV and AIDS and hospitalized at [facility] hospital with presumed PCP pneumonia.  His PCP has responded nicely to Bactrim and prednisone and he is now down to 20 mg of prednisone.  He is developed thrush while on the high-dose steroids antibiotics.  Heotherwise tolerated Biktarvy without any problems.  I saw him in clinic in May 2023 and prescribed him fluconazole for thrush which was apparent.   To clinic today accompanied by his brother who knows of his status but he lives currently in a house with his brother and his mother though mom does not know the status.  He says at times he would like to let her know but whenever he gets to the point where he thinks he will he has troubles.  I told there are many patients in our clinic you do not disclose their status to relatives or friends and that he is not obligated to do so.  I also informed him again and reminded him of undetectable equals on transmissible with regards to sexual partners.        Past Medical History:  Diagnosis Date   Cytomegalovirus (CMV) viremia ([facility]) 07/11/2021   HIV (human immunodeficiency virus infection) ([facility])    PCP (pneumocystis carinii pneumonia) ([facility]) 07/11/2021   Thrush 07/11/2021   Vaccine counseling 09/26/2021    History reviewed. No pertinent surgical history.  History reviewed. No pertinent family history.    Social History   Socioeconomic History   Marital status: Single    Spouse name: Not on file   Number of children: Not on file   Years of education: Not on file   Highest education level: Not on file  Occupational History   Not on file  Tobacco Use   Smoking status: Former    Types: Cigarettes    Passive exposure: Past   Smokeless tobacco: Never  Vaping Use    Vaping status: Never Used  Substance and Sexual Activity   Alcohol use: Yes    Alcohol/week: 2.0 standard drinks of alcohol    Types: 2 Cans of beer per week    Comment: daily   Drug use: Yes    Types: Marijuana    Comment: daily   Sexual activity: Not on file    Comment: declined condoms  Other Topics Concern   Not on file  Social History Narrative   Not on file   Social Determinants of Health   Financial Resource Strain: Not on file  Food Insecurity: Not on file  Transportation Needs: Not on file  Physical Activity: Not on file  Stress: Not on file  Social Connections: Not on file    No Known Allergies   Current Outpatient Medications:    bictegravir-emtricitabine-tenofovir AF (BIKTARVY) 50-200-25 MG TABS tablet, Take 1 tablet by mouth daily., Disp: 30 tablet, Rfl: 11   sulfamethoxazole-trimethoprim (BACTRIM DS) 800-160 MG tablet, Take 1 tablet by mouth daily., Disp: 30 tablet, Rfl: 7   Review of Systems     Objective:   Physical Exam        Assessment & Plan:   HIV disease:  Before nadir was 28 most recent CD4 came up to 169  I will add order HIV viral load CD4 count CBC with differential CMP, RPR GC and  chlamydia and I will continue  [facility],  prescription  He does not like to take multiple pills so I will have him take Bactrim 3 times a week. Also discussed the idea of adding [place] twice daily to his regimen to try to get his CD4 count to come up more rapidly.  He is not want to do that at this point in time   CMV viremia: Resolved  PCP pneumonia resolved.

## 2022-09-16 ENCOUNTER — Telehealth: Payer: Self-pay

## 2022-09-16 NOTE — Telephone Encounter (Signed)
Attempted to call patient to review lab results. Not able to reach him at this time. Left voicemail requesting call back to review provider's message. Juanita Laster, RMA

## 2022-09-16 NOTE — Telephone Encounter (Signed)
-----   Message from [place] sent at 09/13/2022  2:45 PM EDT ----- Regarding: CD4 barely above 200 only needs bactrim for 3 more months if he keeps taking his ARV  ----- Message ----- From: Janace Hoard Lab Results In Sent: 09/11/2022  11:21 PM EDT To: Randall Hiss, MD

## 2022-09-16 NOTE — Telephone Encounter (Signed)
Spoke to patient and advised results and plan. Patient understood.

## 2022-12-17 ENCOUNTER — Other Ambulatory Visit (HOSPITAL_COMMUNITY)
Admission: RE | Admit: 2022-12-17 | Discharge: 2022-12-17 | Disposition: A | Discharge status: Home / Self Care | Payer: Medicaid Other | Source: Ambulatory Visit | Attending: Infectious Disease | Admitting: Infectious Disease

## 2022-12-17 ENCOUNTER — Encounter: Payer: Self-pay | Admitting: Infectious Disease

## 2022-12-17 ENCOUNTER — Ambulatory Visit: Payer: Medicaid Other | Admitting: Infectious Disease

## 2022-12-17 ENCOUNTER — Other Ambulatory Visit: Payer: Self-pay

## 2022-12-17 VITALS — BP 118/78 | HR 70 | Temp 97.7°F | Ht 66.0 in | Wt 145.0 lb

## 2022-12-17 DIAGNOSIS — B20 Human immunodeficiency virus [HIV] disease: Secondary | ICD-10-CM | POA: Insufficient documentation

## 2022-12-17 DIAGNOSIS — Z7185 Encounter for immunization safety counseling: Secondary | ICD-10-CM | POA: Insufficient documentation

## 2022-12-17 DIAGNOSIS — Z23 Encounter for immunization: Secondary | ICD-10-CM

## 2022-12-17 MED ORDER — BICTEGRAVIR-EMTRICITAB-TENOFOV 50-200-25 MG PO TABS: 1.0000 | ORAL_TABLET | Freq: Every day | ORAL | 11 refills | Status: DC

## 2022-12-17 NOTE — Progress Notes (Signed)
Subjective:  Chief Complaint follow-up for HIV disease on medications.  Patient ID: Kent Page, male    DOB: 01-12-1991, 32 y.o.   MRN: [id number]  HPI  Discussed the use of AI scribe software for clinical note transcription with the patient, who gave verbal consent to proceed.  History of Present Illness   The patient, with a history of HIV, presents for a routine follow-up. He has been compliant with his antiretroviral therapy, Biktarvy, but has been off Bactrim for about a month and a half. He reports no significant changes in his health since discontinuing Bactrim. The patient's most recent labs in July showed a CD4 count of 213, an improvement from the previous value of 169, and a viral load of 51, which is close to the undetectable range. The patient also mentions occasional forgetfulness in taking his medication but does not report any adverse effects from potentially doubling up on doses.  The patient is sexually active with men and has had receptive anal intercourse. He has not had any recent sexually transmitted infection (STI) testing, specifically for gonorrhea and chlamydia. He expresses willingness to receive these vaccines and to undergo an anal Pap smear for HPV-related rectal cancer screening.       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  No family history on file.    Social History   Socioeconomic History   Marital status: Single    Spouse name: Not on file   Number of children: Not on file   Years of education: Not on file   Highest education level: Not on file  Occupational History   Not on file  Tobacco Use   Smoking status: Former    Types: Cigarettes    Passive exposure: Past   Smokeless tobacco: Never   Vaping Use   Vaping status: Never Used  Substance and Sexual Activity   Alcohol use: Yes    Alcohol/week: 2.0 standard drinks of alcohol    Types: 2 Cans of beer per week    Comment: daily   Drug use: Yes    Types: Marijuana    Comment: daily   Sexual activity: Not on file    Comment: declined condoms  Other Topics Concern   Not on file  Social History Narrative   Not on file   Social Determinants of Health   Financial Resource Strain: Not on file  Food Insecurity: Not on file  Transportation Needs: Not on file  Physical Activity: Not on file  Stress: Not on file  Social Connections: Not on file    No Known Allergies   Current Outpatient Medications:    bictegravir-emtricitabine-tenofovir AF (BIKTARVY) 50-200-25 MG TABS tablet, Take 1 tablet by mouth daily., Disp: 30 tablet, Rfl: 11   sulfamethoxazole-trimethoprim (BACTRIM DS) 800-160 MG tablet, Take 1 tablet by mouth 3 (three) times a week., Disp: 30 tablet, Rfl: 4   Review of Systems  Constitutional:  Negative for activity change, appetite change, chills, diaphoresis, fatigue, fever and unexpected weight change.  HENT:  Negative for congestion, rhinorrhea, sinus pressure, sneezing, sore throat and trouble swallowing.   Eyes:  Negative for photophobia and visual disturbance.  Respiratory:  Negative for cough, chest tightness, shortness of breath, wheezing and stridor.   Cardiovascular:  Negative  for chest pain, palpitations and leg swelling.  Gastrointestinal:  Negative for abdominal distention, abdominal pain, anal bleeding, blood in stool, constipation, diarrhea, nausea and vomiting.  Genitourinary:  Negative for difficulty urinating, dysuria, flank pain and hematuria.  Musculoskeletal:  Negative for arthralgias, back pain, gait problem, joint swelling and myalgias.  Skin:  Negative for color change, pallor, rash and wound.  Neurological:  Negative for dizziness, tremors, weakness and light-headedness.   Hematological:  Negative for adenopathy. Does not bruise/bleed easily.  Psychiatric/Behavioral:  Negative for agitation, behavioral problems, confusion, decreased concentration, dysphoric mood and sleep disturbance.        Objective:   Physical Exam Constitutional:      Appearance: He is well-developed.  HENT:     Head: Normocephalic and atraumatic.  Eyes:     Conjunctiva/sclera: Conjunctivae normal.  Cardiovascular:     Rate and Rhythm: Normal rate and regular rhythm.  Pulmonary:     Effort: Pulmonary effort is normal. No respiratory distress.     Breath sounds: No wheezing.  Abdominal:     General: There is no distension.     Palpations: Abdomen is soft.  Musculoskeletal:        General: No tenderness. Normal range of motion.     Cervical back: Normal range of motion and neck supple.  Skin:    General: Skin is warm and dry.     Coloration: Skin is not pale.     Findings: No erythema or rash.  Neurological:     General: No focal deficit present.     Mental Status: He is alert and oriented to person, place, and time.  Psychiatric:        Mood and Affect: Mood normal.        Behavior: Behavior normal.        Thought Content: Thought content normal.        Judgment: Judgment normal.           Assessment & Plan:   Assessment and Plan    HIV Viral load near undetectable (51) on Biktarvy. CD4 count improving (213 from 169). Discussed importance of consistent medication adherence to maintain viral suppression and CD4 count improvement. -Continue Biktarvy daily. -Check CD4 count today, HIV RNA, .  Pneumocystis pneumonia (PCP) prophylaxis Off Bactrim for 1.5 months. CD4 count above 200 in July. Discussed discontinuation of Bactrim if CD4 count remains above 200 for 3 consecutive months. -Check CD4 count today. If above 200, discontinue Bactrim.  Vaccinations -Administer updated flu and COVID-19 vaccines today.  Anal cancer screening Discussed importance of anal  Pap smear due to sexual history and risk of HPV-related anal cancer. -Perform anal Pap smear today.  Sexually transmitted infections (STIs) Discussed importance of comprehensive STI screening. -Perform gonorrhea and chlamydia testing today.  Follow-up Discussed importance of regular follow-up to monitor HIV management and overall health. -Schedule follow-up appointment in 4 months.

## 2022-12-18 ENCOUNTER — Telehealth: Payer: Self-pay

## 2022-12-18 LAB — CYTOLOGY, (ORAL, ANAL, URETHRAL) ANCILLARY ONLY
Chlamydia: NEGATIVE
Chlamydia: NEGATIVE
Comment: NEGATIVE
Comment: NEGATIVE
Comment: NORMAL
Comment: NORMAL
Neisseria Gonorrhea: NEGATIVE
Neisseria Gonorrhea: NEGATIVE

## 2022-12-18 LAB — URINE CYTOLOGY ANCILLARY ONLY
Chlamydia: NEGATIVE
Comment: NEGATIVE
Comment: NORMAL
Neisseria Gonorrhea: NEGATIVE

## 2022-12-18 LAB — T-HELPER CELLS (CD4) COUNT (NOT AT ARMC)
CD4 % Helper T Cell: 11 % — ABNORMAL LOW (ref 33–65)
CD4 T Cell Abs: 236 /uL — ABNORMAL LOW (ref 400–1790)

## 2022-12-18 NOTE — Telephone Encounter (Signed)
Spoke with [place], relayed per Dr. Daiva Eves that his CD4 count is now nicely above 200 and he can stop the Bactrim. Encouraged continued adherence to [employer].   He is asking about other lab results. Relayed that viral load, anal pap, and RPR are still pending, but that urine and swabs were negative for gonorrhea and chlamydia. Patient verbalized understanding and has no further questions.   Sandie Ano, RN

## 2022-12-18 NOTE — Telephone Encounter (Signed)
-----   Message from [place] sent at 12/18/2022  3:59 PM EST ----- His CD4 counts are nicely above 200 he does not need Bactrim anymore ----- Message ----- From: Janace Hoard Lab Results In Sent: 12/17/2022  11:19 PM EST To: Randall Hiss, MD

## 2022-12-20 LAB — CBC WITH DIFFERENTIAL/PLATELET
Absolute Lymphocytes: 2674 {cells}/uL (ref 850–3900)
Absolute Monocytes: 422 {cells}/uL (ref 200–950)
Basophils Absolute: 29 {cells}/uL (ref 0–200)
Basophils Relative: 0.6 %
Eosinophils Absolute: 202 {cells}/uL (ref 15–500)
Eosinophils Relative: 4.2 %
HCT: 48.4 % (ref 38.5–50.0)
Hemoglobin: 16 g/dL (ref 13.2–17.1)
MCH: 31.7 pg (ref 27.0–33.0)
MCHC: 33.1 g/dL (ref 32.0–36.0)
MCV: 96 fL (ref 80.0–100.0)
MPV: 9.4 fL (ref 7.5–12.5)
Monocytes Relative: 8.8 %
Neutro Abs: 1474 {cells}/uL — ABNORMAL LOW (ref 1500–7800)
Neutrophils Relative %: 30.7 %
Platelets: 280 10*3/uL (ref 140–400)
RBC: 5.04 10*6/uL (ref 4.20–5.80)
RDW: 12.6 % (ref 11.0–15.0)
Total Lymphocyte: 55.7 %
WBC: 4.8 10*3/uL (ref 3.8–10.8)

## 2022-12-20 LAB — COMPLETE METABOLIC PANEL WITH GFR
AG Ratio: 1.5 (calc) (ref 1.0–2.5)
ALT: 21 U/L (ref 9–46)
AST: 19 U/L (ref 10–40)
Albumin: 4.4 g/dL (ref 3.6–5.1)
Alkaline phosphatase (APISO): 60 U/L (ref 36–130)
BUN/Creatinine Ratio: 12 (calc) (ref 6–22)
BUN: 15 mg/dL (ref 7–25)
CO2: 30 mmol/L (ref 20–32)
Calcium: 9.8 mg/dL (ref 8.6–10.3)
Chloride: 102 mmol/L (ref 98–110)
Creat: 1.27 mg/dL — ABNORMAL HIGH (ref 0.60–1.26)
Globulin: 2.9 g/dL (ref 1.9–3.7)
Glucose, Bld: 97 mg/dL (ref 65–99)
Potassium: 4.4 mmol/L (ref 3.5–5.3)
Sodium: 139 mmol/L (ref 135–146)
Total Bilirubin: 1.1 mg/dL (ref 0.2–1.2)
Total Protein: 7.3 g/dL (ref 6.1–8.1)
eGFR: 76 mL/min/{1.73_m2} (ref >=60)

## 2022-12-20 LAB — RPR: RPR Ser Ql: NONREACTIVE

## 2022-12-20 LAB — LIPID PANEL
Cholesterol: 195 mg/dL (ref <200)
HDL: 59 mg/dL (ref >=40)
LDL Cholesterol (Calc): 119 mg/dL — ABNORMAL HIGH
Non-HDL Cholesterol (Calc): 136 mg/dL — ABNORMAL HIGH (ref <130)
Total CHOL/HDL Ratio: 3.3 (calc) (ref <5.0)
Triglycerides: 78 mg/dL (ref <150)

## 2022-12-20 LAB — HIV RNA, RTPCR W/R GT (RTI, PI,INT)
HIV 1 RNA Quant: 65 {copies}/mL — ABNORMAL HIGH
HIV-1 RNA Quant, Log: 1.81 {Log} — ABNORMAL HIGH

## 2022-12-20 LAB — HEPATITIS C AB W/RFL RNA, PCR + GENO: Hepatitis C Ab: NONREACTIVE

## 2022-12-23 LAB — CYTOLOGY - PAP: Diagnosis: HIGH — AB

## 2022-12-24 ENCOUNTER — Telehealth: Payer: Self-pay

## 2022-12-24 ENCOUNTER — Other Ambulatory Visit: Payer: Self-pay | Admitting: Infectious Disease

## 2022-12-24 DIAGNOSIS — O282 Abnormal cytological finding on antenatal screening of mother: Secondary | ICD-10-CM

## 2022-12-24 DIAGNOSIS — R85610 Atypical squamous cells of undetermined significance on cytologic smear of anus (ASC-US): Secondary | ICD-10-CM

## 2022-12-24 NOTE — Telephone Encounter (Signed)
-----   Message from [place] sent at 12/24/2022 12:54 PM EST ----- Regarding: ascus on anal pap needs HRA orders are in for this  ----- Message ----- From: Interface, [facility] Lab Results In Sent: 12/17/2022  11:19 PM EST To: Randall Hiss, MD

## 2022-12-24 NOTE — Progress Notes (Signed)
ASCUS on anal pap referral for HRA

## 2023-02-17 ENCOUNTER — Ambulatory Visit: Payer: Self-pay | Admitting: General Surgery

## 2023-02-17 NOTE — H&P (Signed)
  REFERRING PHYSICIAN:  Fleeta Rothman, [address]  PROVIDER:  BERNARDA WANDA NED, MD  MRN: [id number] DOB: 1990-05-01 DATE OF ENCOUNTER: 02/17/2023  Subjective   Chief Complaint: No chief complaint on file.     History of Present Illness: Kent Page is a 33 y.o. male who is seen today as an office consultation at the request of Dr. Fleeta Rothman for evaluation of No chief complaint on file. .  Diagnosed in 2023 with HIV/AIDS after hospitalization at [place] with presumed PCP pneumonia.  Has been on treatment since then with steady rise in immune numbers.   ASCUS noted on anal Pap test in Nov.   Review of Systems: A complete review of systems was obtained from the patient.  I have reviewed this information and discussed as appropriate with the patient.  See HPI as well for other ROS.   Medical History: No past medical history on file.  There is no problem list on file for this patient.   No past surgical history on file.   Not on File  No current outpatient medications on file prior to visit.   No current facility-administered medications on file prior to visit.    No family history on file.   Social History   Tobacco Use  Smoking Status Not on file  Smokeless Tobacco Not on file     Social History   Socioeconomic History   Marital status: Single    Objective:    There were no vitals filed for this visit.   Exam Gen: NAD  Labs, Imaging and Diagnostic Testing: HIV: 65  CD4 236  Assessment and Plan:  Diagnoses and all orders for this visit:  Pap smear of anus with ASCUS    Patient with an abnormal Pap smear.  I recommended high-resolution anoscopy with possible biopsy or ablation.  We have discussed this in detail including risk of bleeding, pain and recurrence.  All questions were answered.  Patient would like to proceed.  [name], MD Colon and Rectal Surgery [facility]

## 2023-04-03 ENCOUNTER — Encounter (HOSPITAL_BASED_OUTPATIENT_CLINIC_OR_DEPARTMENT_OTHER): Payer: Self-pay | Admitting: General Surgery

## 2023-04-03 ENCOUNTER — Other Ambulatory Visit: Payer: Self-pay

## 2023-04-11 DIAGNOSIS — Z01818 Encounter for other preprocedural examination: Secondary | ICD-10-CM

## 2023-04-14 ENCOUNTER — Ambulatory Visit: Payer: Medicaid Other | Admitting: Infectious Disease

## 2023-04-29 ENCOUNTER — Encounter: Payer: Self-pay | Admitting: Infectious Disease

## 2023-04-29 DIAGNOSIS — R85610 Atypical squamous cells of undetermined significance on cytologic smear of anus (ASC-US): Secondary | ICD-10-CM

## 2023-04-29 HISTORY — DX: Atypical squamous cells of undetermined significance on cytologic smear of anus (ASC-US): R85.610

## 2023-04-29 NOTE — Progress Notes (Unsigned)
 Subjective:   Chief complaint: follow-up for HIV disease on medications   Patient ID: Kent Page, male    DOB: [birth year], 33 y.o.   MRN: [id number]  HPI  Discussed the use of AI scribe software for clinical note transcription with the patient, who gave verbal consent to proceed.  History of Present Illness   The patient, with a history of HIV, presents with concerns about potential Medicaid cuts and the cost of his HIV medication, Biktarvy. He expresses worry about the political climate and the potential impact on his healthcare coverage. He is a [place] citizen and currently has his medication covered by Medicaid. He expresses relief at the doctor's reassurance that, in a worst-case scenario, the pharmaceutical company [employer] would likely provide Biktarvy for free.  The patient also discusses his family's lack of knowledge about his HIV status, indicating that only his brother s aware. He expresses hesitation about disclosing his status to his mother.  In addition, the patient has an upcoming appointment for an abnormal anal pap smear at the end of April. He also mentions a slight increase in blood pressure, which he attributes to stress. Despite these concerns, the patient reports feeling fine overall.       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  No family history on file.    Social History   Socioeconomic History   Marital status: Single    Spouse name: Not on file   Number of children: Not on file   Years of education: Not on file   Highest education level: Not on file  Occupational History   Not on file  Tobacco Use   Smoking status: Former    Types: Cigarettes    Passive  exposure: Past   Smokeless tobacco: Never  Vaping Use   Vaping status: Never Used  Substance and Sexual Activity   Alcohol use: Yes    Alcohol/week: 18.0 standard drinks of alcohol    Types: 18 Cans of beer per week    Comment: 2-3 daily   Drug use: Yes    Types: Marijuana    Comment: daily   Sexual activity: Not on file    Comment: declined condoms  Other Topics Concern   Not on file  Social History Narrative   Not on file   Social Drivers of Health   Financial Resource Strain: Not on file  Food Insecurity: Not on file  Transportation Needs: Not on file  Physical Activity: Not on file  Stress: Not on file  Social Connections: Not on file    No Known Allergies   Current Outpatient Medications:    bictegravir-emtricitabine-tenofovir AF (BIKTARVY) 50-200-25 MG TABS tablet, Take 1 tablet by mouth daily., Disp: 30 tablet, Rfl: 11   sulfamethoxazole-trimethoprim (BACTRIM DS) 800-160 MG tablet, Take 1 tablet by mouth 3 (three) times a week. (Patient not taking: Reported on 12/17/2022), Disp: 30 tablet, Rfl: 4   Review of Systems  Constitutional:  Negative for activity change, appetite change, chills, diaphoresis, fatigue, fever and unexpected weight change.  HENT:  Negative for congestion, rhinorrhea, sinus pressure, sneezing, sore throat and trouble swallowing.   Eyes:  Negative for photophobia and visual disturbance.  Respiratory:  Negative for  cough, chest tightness, shortness of breath, wheezing and stridor.   Cardiovascular:  Negative for chest pain, palpitations and leg swelling.  Gastrointestinal:  Negative for abdominal distention, abdominal pain, anal bleeding, blood in stool, constipation, diarrhea, nausea and vomiting.  Genitourinary:  Negative for difficulty urinating, dysuria, flank pain and hematuria.  Musculoskeletal:  Negative for arthralgias, back pain, gait problem, joint swelling and myalgias.  Skin:  Negative for color change, pallor, rash and wound.   Neurological:  Negative for dizziness, tremors, weakness and light-headedness.  Hematological:  Negative for adenopathy. Does not bruise/bleed easily.  Psychiatric/Behavioral:  Negative for agitation, behavioral problems, confusion, decreased concentration, dysphoric mood and sleep disturbance.        Objective:   Physical Exam Constitutional:      Appearance: He is well-developed.  HENT:     Head: Normocephalic and atraumatic.  Eyes:     Conjunctiva/sclera: Conjunctivae normal.  Cardiovascular:     Rate and Rhythm: Normal rate and regular rhythm.  Pulmonary:     Effort: Pulmonary effort is normal. No respiratory distress.     Breath sounds: No wheezing.  Abdominal:     General: There is no distension.     Palpations: Abdomen is soft.  Musculoskeletal:        General: No tenderness. Normal range of motion.     Cervical back: Normal range of motion and neck supple.  Skin:    General: Skin is warm and dry.     Coloration: Skin is not pale.     Findings: No erythema or rash.  Neurological:     General: No focal deficit present.     Mental Status: He is alert and oriented to person, place, and time.  Psychiatric:        Mood and Affect: Mood normal.        Behavior: Behavior normal.        Thought Content: Thought content normal.        Judgment: Judgment normal.           Assessment & Plan:   Assessment and Plan    HIV infection Viral load slightly above target; CD4 count good. Adherent to [employer]. Potential Medicaid cuts may affect access, but assistance programs available. - Continue Biktarvy. - Order labs for viral load and CD4 count. - Schedule follow-up in six months.  Abnormal anal Pap smear Procedure with Dr. Maisie Fus scheduled for further evaluation. - Ensure follow-up with Dr. Maisie Fus for HRA procedure.  Sexually transmitted infection (STI) screening Agreed to comprehensive STI screening. Doxycycline PEP discussed for future prevention of chlamydia and  syphilis. - Perform comprehensive STI screening. - Consider doxycycline PEP if recurrent STIs occur.  Elevated creatinine Slightly elevated creatinine, possibly due to inadequate hydration. BIC also does cause slight increase in cr due to inhibition of distal tubular secretion without effecting GFR - Encourage adequate hydration. - Monitor kidney function in future labs.  Vaccination status Reviewed vaccination status; discussed tetanus vaccine due every ten years. - Review vaccination records for tetanus vaccine. - Administer tetanus vaccine if due and he consents.

## 2023-04-30 ENCOUNTER — Other Ambulatory Visit: Payer: Self-pay

## 2023-04-30 ENCOUNTER — Encounter: Payer: Self-pay | Admitting: Infectious Disease

## 2023-04-30 ENCOUNTER — Other Ambulatory Visit (HOSPITAL_COMMUNITY)
Admission: RE | Admit: 2023-04-30 | Discharge: 2023-04-30 | Disposition: A | Discharge status: Home / Self Care | Source: Ambulatory Visit | Attending: Infectious Disease | Admitting: Infectious Disease

## 2023-04-30 ENCOUNTER — Ambulatory Visit (INDEPENDENT_AMBULATORY_CARE_PROVIDER_SITE_OTHER): Payer: Medicaid Other | Admitting: Infectious Disease

## 2023-04-30 VITALS — BP 123/85 | HR 66 | Temp 97.7°F | Wt 150.6 lb

## 2023-04-30 DIAGNOSIS — R7989 Other specified abnormal findings of blood chemistry: Secondary | ICD-10-CM | POA: Diagnosis not present

## 2023-04-30 DIAGNOSIS — Z7185 Encounter for immunization safety counseling: Secondary | ICD-10-CM | POA: Insufficient documentation

## 2023-04-30 DIAGNOSIS — R030 Elevated blood-pressure reading, without diagnosis of hypertension: Secondary | ICD-10-CM | POA: Diagnosis not present

## 2023-04-30 DIAGNOSIS — R85610 Atypical squamous cells of undetermined significance on cytologic smear of anus (ASC-US): Secondary | ICD-10-CM

## 2023-04-30 DIAGNOSIS — Z113 Encounter for screening for infections with a predominantly sexual mode of transmission: Secondary | ICD-10-CM

## 2023-04-30 DIAGNOSIS — B20 Human immunodeficiency virus [HIV] disease: Secondary | ICD-10-CM | POA: Insufficient documentation

## 2023-04-30 MED ORDER — BICTEGRAVIR-EMTRICITAB-TENOFOV 50-200-25 MG PO TABS: 1.0000 | ORAL_TABLET | Freq: Every day | ORAL | 11 refills | Status: DC

## 2023-05-01 LAB — CYTOLOGY, (ORAL, ANAL, URETHRAL) ANCILLARY ONLY
Chlamydia: NEGATIVE
Chlamydia: NEGATIVE
Comment: NEGATIVE
Comment: NEGATIVE
Comment: NORMAL
Comment: NORMAL
Neisseria Gonorrhea: NEGATIVE
Neisseria Gonorrhea: NEGATIVE

## 2023-05-01 LAB — URINE CYTOLOGY ANCILLARY ONLY
Chlamydia: NEGATIVE
Comment: NEGATIVE
Comment: NORMAL
Neisseria Gonorrhea: NEGATIVE

## 2023-05-02 LAB — T-HELPER CELLS (CD4) COUNT (NOT AT ARMC)
CD4 % Helper T Cell: 12 % — ABNORMAL LOW (ref 33–65)
CD4 T Cell Abs: 291 /uL — ABNORMAL LOW (ref 400–1790)

## 2023-05-04 LAB — CBC WITH DIFFERENTIAL/PLATELET
Absolute Lymphocytes: 2698 {cells}/uL (ref 850–3900)
Absolute Monocytes: 469 {cells}/uL (ref 200–950)
Basophils Absolute: 31 {cells}/uL (ref 0–200)
Basophils Relative: 0.6 %
Eosinophils Absolute: 107 {cells}/uL (ref 15–500)
Eosinophils Relative: 2.1 %
HCT: 39.9 % (ref 38.5–50.0)
Hemoglobin: 13.6 g/dL (ref 13.2–17.1)
MCH: 32.5 pg (ref 27.0–33.0)
MCHC: 34.1 g/dL (ref 32.0–36.0)
MCV: 95.5 fL (ref 80.0–100.0)
MPV: 9.1 fL (ref 7.5–12.5)
Monocytes Relative: 9.2 %
Neutro Abs: 1795 {cells}/uL (ref 1500–7800)
Neutrophils Relative %: 35.2 %
Platelets: 313 10*3/uL (ref 140–400)
RBC: 4.18 10*6/uL — ABNORMAL LOW (ref 4.20–5.80)
RDW: 13.1 % (ref 11.0–15.0)
Total Lymphocyte: 52.9 %
WBC: 5.1 10*3/uL (ref 3.8–10.8)

## 2023-05-04 LAB — COMPLETE METABOLIC PANEL WITH GFR
AG Ratio: 1.9 (calc) (ref 1.0–2.5)
ALT: 15 U/L (ref 9–46)
AST: 16 U/L (ref 10–40)
Albumin: 4.6 g/dL (ref 3.6–5.1)
Alkaline phosphatase (APISO): 51 U/L (ref 36–130)
BUN/Creatinine Ratio: 9 (calc) (ref 6–22)
BUN: 13 mg/dL (ref 7–25)
CO2: 29 mmol/L (ref 20–32)
Calcium: 9 mg/dL (ref 8.6–10.3)
Chloride: 102 mmol/L (ref 98–110)
Creat: 1.43 mg/dL — ABNORMAL HIGH (ref 0.60–1.26)
Globulin: 2.4 g/dL (ref 1.9–3.7)
Glucose, Bld: 89 mg/dL (ref 65–99)
Potassium: 3.9 mmol/L (ref 3.5–5.3)
Sodium: 138 mmol/L (ref 135–146)
Total Bilirubin: 0.7 mg/dL (ref 0.2–1.2)
Total Protein: 7 g/dL (ref 6.1–8.1)

## 2023-05-04 LAB — LIPID PANEL
Cholesterol: 203 mg/dL — ABNORMAL HIGH (ref <200)
HDL: 69 mg/dL (ref >=40)
LDL Cholesterol (Calc): 115 mg/dL — ABNORMAL HIGH
Non-HDL Cholesterol (Calc): 134 mg/dL — ABNORMAL HIGH (ref <130)
Total CHOL/HDL Ratio: 2.9 (calc) (ref <5.0)
Triglycerides: 91 mg/dL (ref <150)

## 2023-05-04 LAB — HIV RNA, RTPCR W/R GT (RTI, PI,INT)
HIV 1 RNA Quant: 23 {copies}/mL — ABNORMAL HIGH
HIV-1 RNA Quant, Log: 1.36 {Log_copies}/mL — ABNORMAL HIGH

## 2023-05-04 LAB — RPR: RPR Ser Ql: NONREACTIVE

## 2023-05-29 ENCOUNTER — Encounter (HOSPITAL_BASED_OUTPATIENT_CLINIC_OR_DEPARTMENT_OTHER): Payer: Self-pay | Admitting: General Surgery

## 2023-05-29 ENCOUNTER — Other Ambulatory Visit: Payer: Self-pay

## 2023-05-29 IMAGING — CR DG CHEST 2V
1 series · 2 of 2 positions shown · non-contrast
Comparison: 11/21/2017

CLINICAL DATA: Shortness of breath, rash, loss of appetite,
symptoms for about a year recently worsened, 20 pounds weight loss

EXAM:
CHEST - 2 VIEW

[Series 1: w chest pa · 0.14mm/px · 2 of 2 slices shown]
[im 1/2]
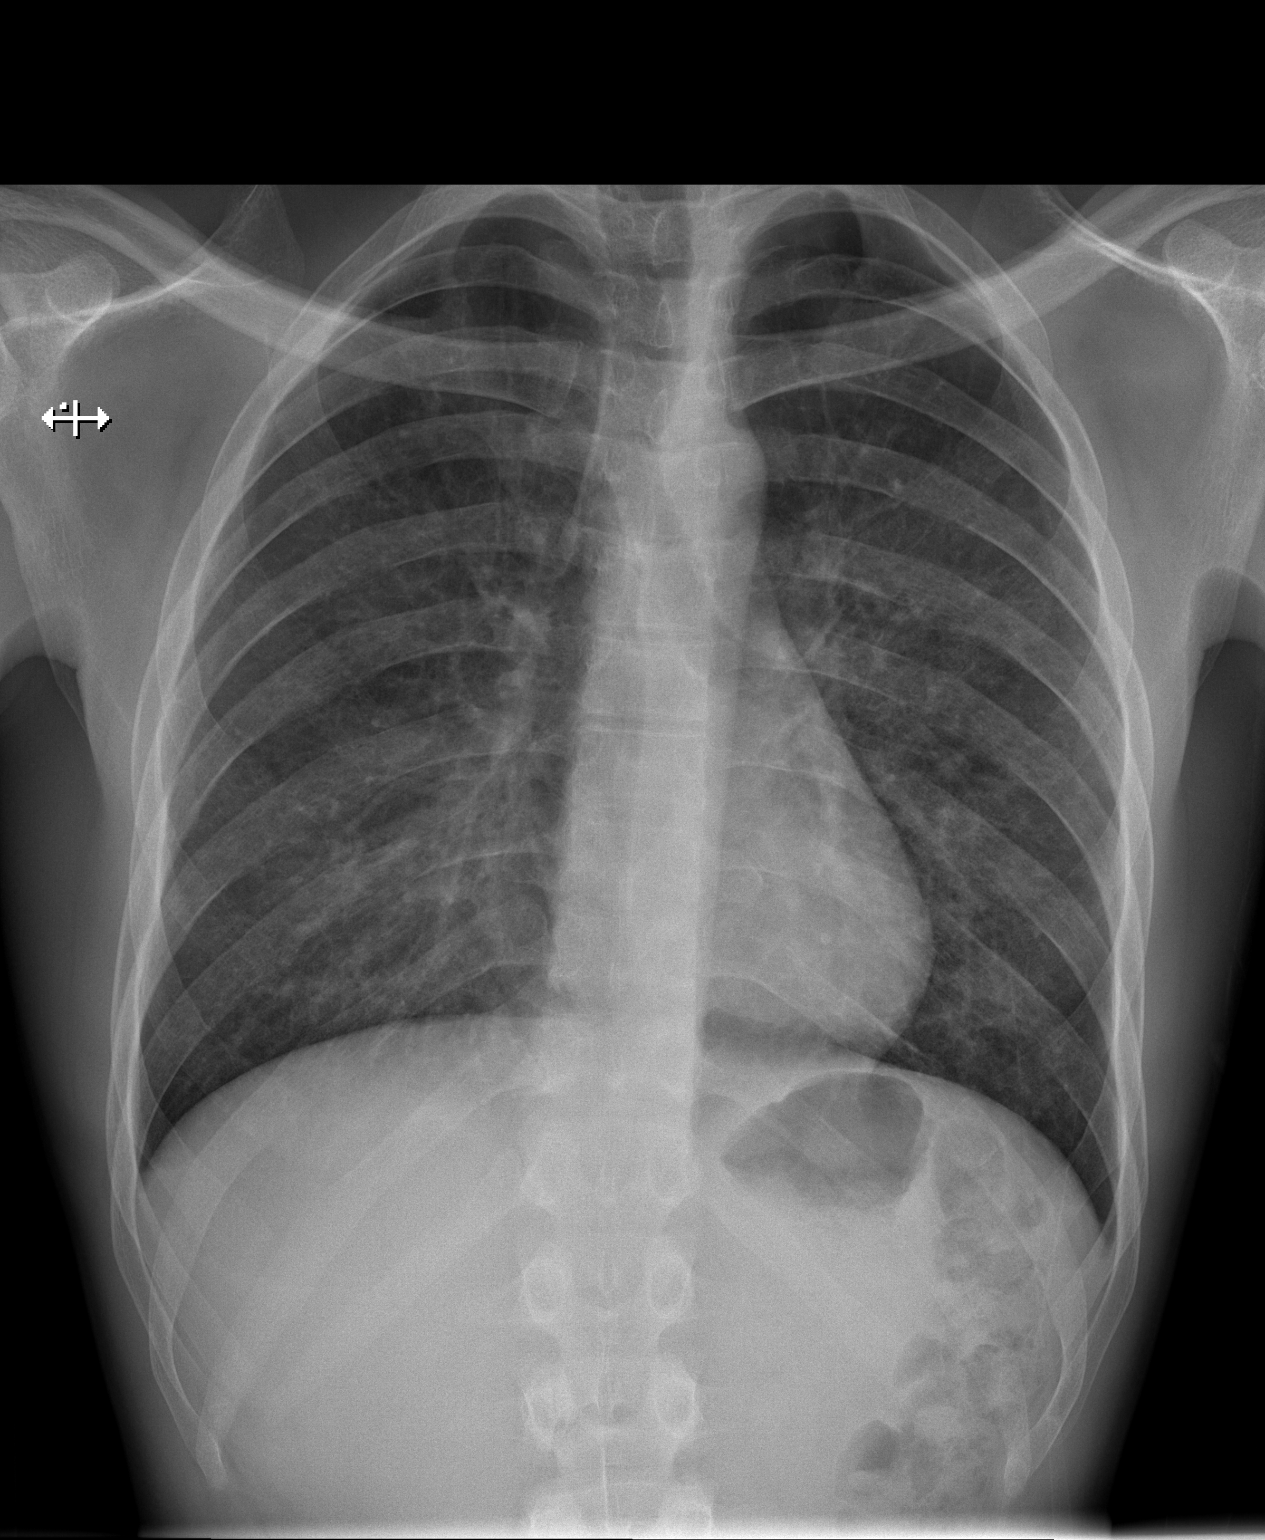
[im 2/2]
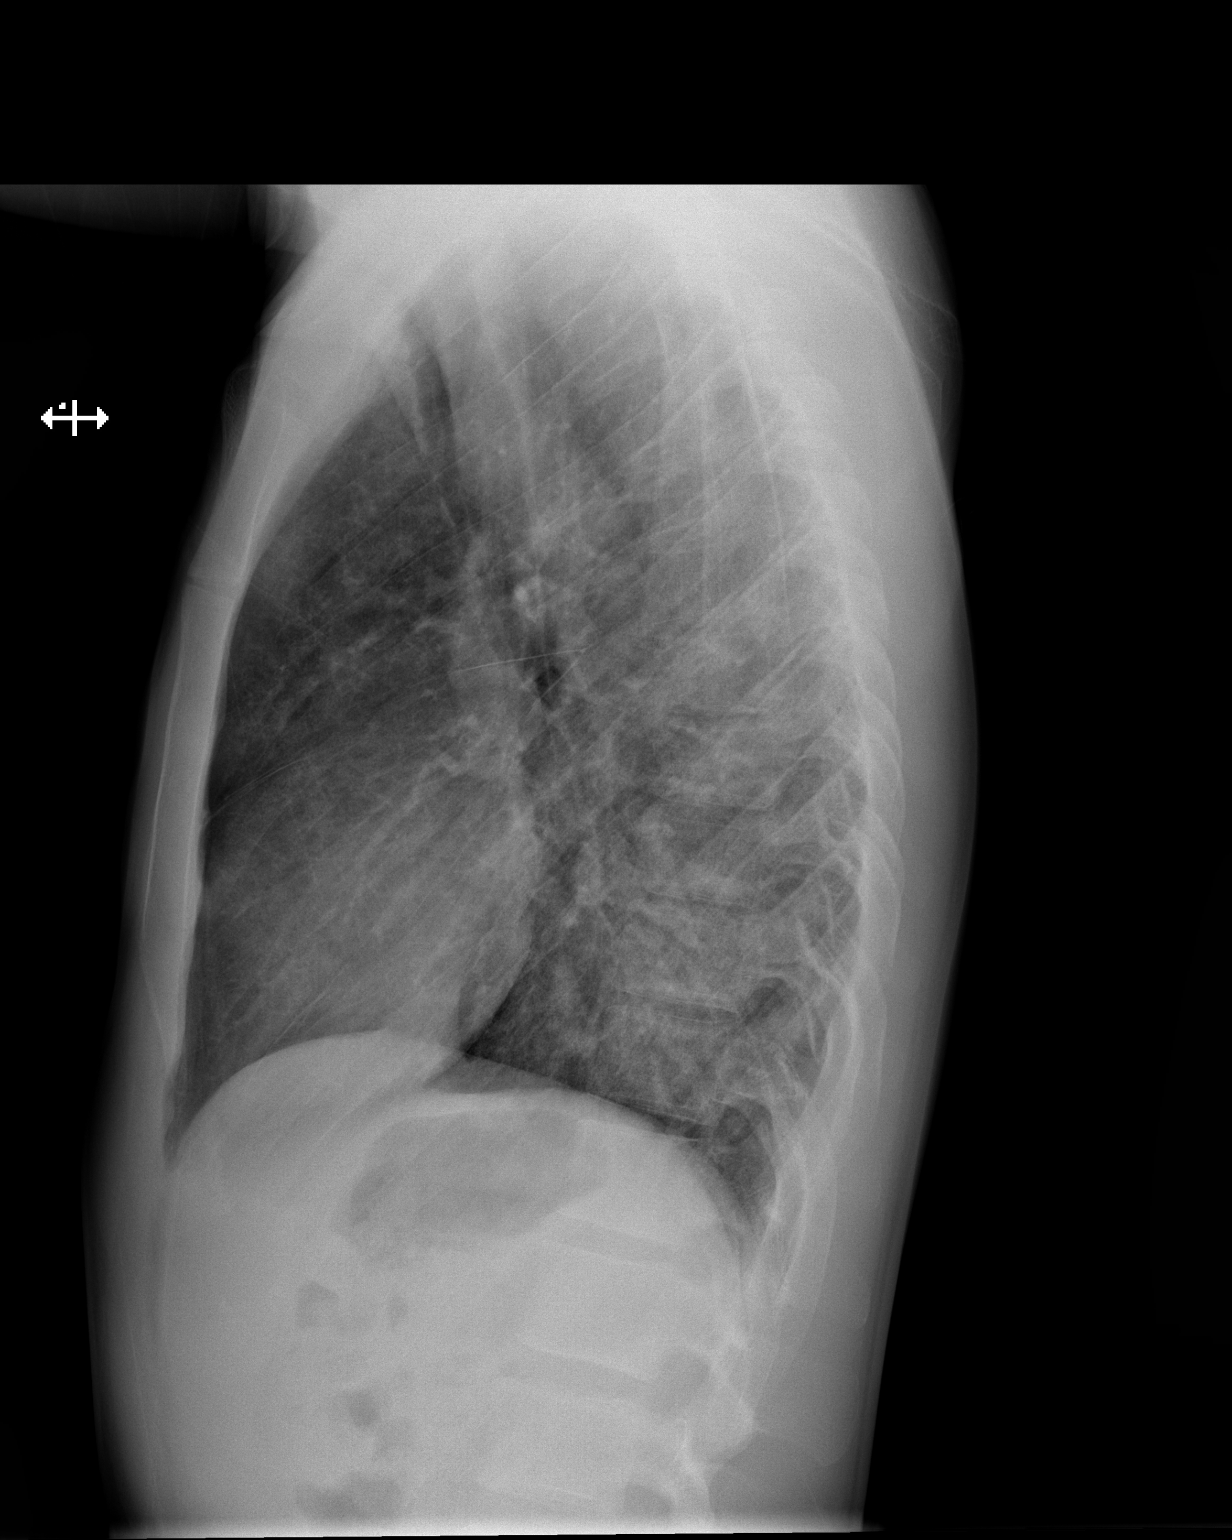

[2 of 2 positions shown; findings below may reference images not displayed]

FINDINGS: Normal heart size, mediastinal contours, and pulmonary vascularity.

Hazy BILATERAL perihilar infiltrates extending towards bases
question atypical pneumonia or less likely noncardiogenic edema.

No segmental consolidation, pleural effusion, or pneumothorax.

Minimal levoconvex upper thoracic scoliosis.
IMPRESSION: Hazy BILATERAL perihilar to basilar infiltrates question atypical
pneumonia or less likely pulmonary edema.

## 2023-06-04 ENCOUNTER — Ambulatory Visit: Payer: Self-pay | Admitting: General Surgery

## 2023-06-04 NOTE — H&P (Signed)
  REFERRING PHYSICIAN:  Ernie Heal, [address]   PROVIDER:  Denese Finn, MD   MRN: [id number] DOB: 29-Dec-1990 DATE OF ENCOUNTER: 02/17/2023   Subjective    Chief Complaint: No chief complaint on file.       History of Present Illness: Kent Page is a 33 y.o. male who is seen today as an office consultation at the request of Dr. Ernie Heal for evaluation of No chief complaint on file. .  Diagnosed in 2023 with HIV/AIDS after hospitalization at [place] with presumed PCP pneumonia.  Has been on treatment since then with steady rise in immune numbers.   ASCUS noted on anal Pap test in Nov.     Review of Systems: A complete review of systems was obtained from the patient.  I have reviewed this information and discussed as appropriate with the patient.  See HPI as well for other ROS.     Medical History: Past Medical History  No past medical history on file.      Problem List  There is no problem list on file for this patient.      Past Surgical History  No past surgical history on file.      Allergies  Not on File     Medications Ordered Prior to Encounter  No current outpatient medications on file prior to visit.    No current facility-administered medications on file prior to visit.        Family History  No family history on file.      Tobacco Use History  Social History       Tobacco Use  Smoking Status Not on file  Smokeless Tobacco Not on file        Social History  Social History        Socioeconomic History   Marital status: Single        Objective:        Exam Gen: NAD CV: RRR Pulm: CTA    Labs, Imaging and Diagnostic Testing: HIV: 65   CD4 236   Assessment and Plan:  Diagnoses and all orders for this visit:   Pap smear of anus with ASCUS     Patient with an abnormal Pap smear.  I recommended high-resolution anoscopy with possible biopsy or ablation.  We have discussed this in detail including risk of  bleeding, pain and recurrence.  All questions were answered.  Patient would like to proceed.   Fernande Howells, MD Colon and Rectal Surgery [facility]

## 2023-06-04 NOTE — H&P (View-Only) (Signed)
  REFERRING PHYSICIAN:  Ernie Heal, [address]   PROVIDER:  Denese Finn, MD   MRN: [id number] DOB: 29-Dec-1990 DATE OF ENCOUNTER: 02/17/2023   Subjective    Chief Complaint: No chief complaint on file.       History of Present Illness: Fredric Slabach is a 33 y.o. male who is seen today as an office consultation at the request of Dr. Ernie Heal for evaluation of No chief complaint on file. .  Diagnosed in 2023 with HIV/AIDS after hospitalization at [place] with presumed PCP pneumonia.  Has been on treatment since then with steady rise in immune numbers.   ASCUS noted on anal Pap test in Nov.     Review of Systems: A complete review of systems was obtained from the patient.  I have reviewed this information and discussed as appropriate with the patient.  See HPI as well for other ROS.     Medical History: Past Medical History  No past medical history on file.      Problem List  There is no problem list on file for this patient.      Past Surgical History  No past surgical history on file.      Allergies  Not on File     Medications Ordered Prior to Encounter  No current outpatient medications on file prior to visit.    No current facility-administered medications on file prior to visit.        Family History  No family history on file.      Tobacco Use History  Social History       Tobacco Use  Smoking Status Not on file  Smokeless Tobacco Not on file        Social History  Social History        Socioeconomic History   Marital status: Single        Objective:        Exam Gen: NAD CV: RRR Pulm: CTA    Labs, Imaging and Diagnostic Testing: HIV: 65   CD4 236   Assessment and Plan:  Diagnoses and all orders for this visit:   Pap smear of anus with ASCUS     Patient with an abnormal Pap smear.  I recommended high-resolution anoscopy with possible biopsy or ablation.  We have discussed this in detail including risk of  bleeding, pain and recurrence.  All questions were answered.  Patient would like to proceed.   Fernande Howells, MD Colon and Rectal Surgery [facility]

## 2023-06-06 ENCOUNTER — Other Ambulatory Visit: Payer: Self-pay

## 2023-06-06 ENCOUNTER — Ambulatory Visit (HOSPITAL_BASED_OUTPATIENT_CLINIC_OR_DEPARTMENT_OTHER): Admitting: Certified Registered"

## 2023-06-06 ENCOUNTER — Encounter (HOSPITAL_BASED_OUTPATIENT_CLINIC_OR_DEPARTMENT_OTHER): Payer: Self-pay | Admitting: General Surgery

## 2023-06-06 ENCOUNTER — Encounter (HOSPITAL_BASED_OUTPATIENT_CLINIC_OR_DEPARTMENT_OTHER)
Admission: RE | Disposition: A | Discharge status: Home / Self Care | Payer: Self-pay | Source: Home / Self Care | Attending: General Surgery

## 2023-06-06 ENCOUNTER — Ambulatory Visit (HOSPITAL_BASED_OUTPATIENT_CLINIC_OR_DEPARTMENT_OTHER)
Admission: RE | Admit: 2023-06-06 | Discharge: 2023-06-06 | Disposition: A | Discharge status: Home / Self Care | Payer: Medicaid Other | Attending: General Surgery | Admitting: General Surgery

## 2023-06-06 DIAGNOSIS — R85610 Atypical squamous cells of undetermined significance on cytologic smear of anus (ASC-US): Secondary | ICD-10-CM

## 2023-06-06 DIAGNOSIS — K6282 Dysplasia of anus: Secondary | ICD-10-CM | POA: Insufficient documentation

## 2023-06-06 DIAGNOSIS — Z87891 Personal history of nicotine dependence: Secondary | ICD-10-CM | POA: Diagnosis not present

## 2023-06-06 DIAGNOSIS — Z01818 Encounter for other preprocedural examination: Secondary | ICD-10-CM

## 2023-06-06 HISTORY — PX: HIGH RESOLUTION ANOSCOPY: SHX6345

## 2023-06-06 SURGERY — ANOSCOPY, HIGH RESOLUTION
Anesthesia: Monitor Anesthesia Care

## 2023-06-06 MED ORDER — ACETIC ACID 5 % SOLN
Status: DC | PRN
  Administered 2023-06-06: 1 via TOPICAL

## 2023-06-06 MED ORDER — ACETAMINOPHEN 500 MG PO TABS
ORAL_TABLET | ORAL | Status: AC
  Filled 2023-06-06: qty 2

## 2023-06-06 MED ORDER — OXYCODONE HCL 5 MG/5ML PO SOLN: 5.0000 mg | Freq: Once | ORAL | Status: DC | PRN

## 2023-06-06 MED ORDER — OXYCODONE HCL 5 MG PO TABS: 5.0000 mg | ORAL_TABLET | Freq: Once | ORAL | Status: DC | PRN

## 2023-06-06 MED ORDER — ACETAMINOPHEN 500 MG PO TABS
1000.0000 mg | ORAL_TABLET | ORAL | Status: AC
  Administered 2023-06-06: 1000 mg via ORAL

## 2023-06-06 MED ORDER — PROPOFOL 10 MG/ML IV BOLUS
INTRAVENOUS | Status: DC | PRN
  Administered 2023-06-06: 200 ug/kg/min via INTRAVENOUS
  Administered 2023-06-06: 40 mg via INTRAVENOUS

## 2023-06-06 MED ORDER — FENTANYL CITRATE (PF) 100 MCG/2ML IJ SOLN
INTRAMUSCULAR | Status: DC | PRN
  Administered 2023-06-06: 50 ug via INTRAVENOUS

## 2023-06-06 MED ORDER — DROPERIDOL 2.5 MG/ML IJ SOLN: 0.6250 mg | Freq: Once | INTRAMUSCULAR | Status: DC | PRN

## 2023-06-06 MED ORDER — DOCUSATE SODIUM 100 MG PO CAPS: 100.0000 mg | ORAL_CAPSULE | Freq: Two times a day (BID) | ORAL | Status: DC

## 2023-06-06 MED ORDER — MIDAZOLAM HCL 2 MG/2ML IJ SOLN
INTRAMUSCULAR | Status: DC | PRN
  Administered 2023-06-06 (×2): 1 mg via INTRAVENOUS

## 2023-06-06 MED ORDER — ACETAMINOPHEN 10 MG/ML IV SOLN: 1000.0000 mg | Freq: Once | INTRAVENOUS | Status: DC | PRN

## 2023-06-06 MED ORDER — BUPIVACAINE-EPINEPHRINE 0.5% -1:200000 IJ SOLN
INTRAMUSCULAR | Status: DC | PRN
  Administered 2023-06-06: 30 mL

## 2023-06-06 MED ORDER — LACTATED RINGERS IV SOLN: INTRAVENOUS | Status: DC

## 2023-06-06 MED ORDER — FENTANYL CITRATE (PF) 100 MCG/2ML IJ SOLN
INTRAMUSCULAR | Status: AC
  Filled 2023-06-06: qty 2

## 2023-06-06 MED ORDER — MIDAZOLAM HCL 2 MG/2ML IJ SOLN
INTRAMUSCULAR | Status: AC
  Filled 2023-06-06: qty 2

## 2023-06-06 MED ORDER — FENTANYL CITRATE (PF) 100 MCG/2ML IJ SOLN: 25.0000 ug | INTRAMUSCULAR | Status: DC | PRN

## 2023-06-06 MED ORDER — SODIUM CHLORIDE 0.9% FLUSH: 3.0000 mL | Freq: Two times a day (BID) | INTRAVENOUS | Status: DC

## 2023-06-06 MED ORDER — TRAMADOL HCL 50 MG PO TABS: 50.0000 mg | ORAL_TABLET | Freq: Four times a day (QID) | ORAL | 0 refills | Status: DC | PRN

## 2023-06-06 SURGICAL SUPPLY — 35 items
BENZOIN TINCTURE PRP APPL 2/3 (GAUZE/BANDAGES/DRESSINGS) ×1 IMPLANT
BLADE SURG 10 STRL SS (BLADE) IMPLANT
BRIEF MESH DISP 2XL (UNDERPADS AND DIAPERS) ×1 IMPLANT
COVER BACK TABLE 60X90IN (DRAPES) ×1 IMPLANT
DRAPE HYSTEROSCOPY (MISCELLANEOUS) IMPLANT
DRAPE LAPAROTOMY 100X72 PEDS (DRAPES) ×1 IMPLANT
DRAPE UTILITY XL STRL (DRAPES) ×1 IMPLANT
ELECTRODE REM PT RTRN 9FT ADLT (ELECTROSURGICAL) ×1 IMPLANT
GAUZE 4X4 16PLY ~~LOC~~+RFID DBL (SPONGE) ×1 IMPLANT
GAUZE PAD ABD 8X10 STRL (GAUZE/BANDAGES/DRESSINGS) ×1 IMPLANT
GAUZE SPONGE 4X4 12PLY STRL (GAUZE/BANDAGES/DRESSINGS) IMPLANT
GAUZE SPONGE 4X4 12PLY STRL LF (GAUZE/BANDAGES/DRESSINGS) IMPLANT
GLOVE BIO SURGEON STRL SZ 6.5 (GLOVE) ×1 IMPLANT
GLOVE INDICATOR 6.5 STRL GRN (GLOVE) ×1 IMPLANT
KIT SIGMOIDOSCOPE (SET/KITS/TRAYS/PACK) IMPLANT
LEGGING LITHOTOMY PAIR STRL (DRAPES) IMPLANT
NDL BIOPSY 14X6 SOFT TISS (NEEDLE) IMPLANT
NDL HYPO 22X1.5 SAFETY MO (MISCELLANEOUS) ×1 IMPLANT
NEEDLE BIOPSY 14X6 SOFT TISS (NEEDLE) IMPLANT
NEEDLE HYPO 22X1.5 SAFETY MO (MISCELLANEOUS) ×1 IMPLANT
NS IRRIG 1000ML POUR BTL (IV SOLUTION) ×1 IMPLANT
PACK BASIN DAY SURGERY FS (CUSTOM PROCEDURE TRAY) ×1 IMPLANT
PAD ARMBOARD POSITIONER FOAM (MISCELLANEOUS) IMPLANT
PENCIL SMOKE EVACUATOR (MISCELLANEOUS) ×1 IMPLANT
SLEEVE SCD COMPRESS KNEE MED (STOCKING) ×1 IMPLANT
SPIKE FLUID TRANSFER (MISCELLANEOUS) ×1 IMPLANT
SPONGE SURGIFOAM ABS GEL 12-7 (HEMOSTASIS) IMPLANT
SUT CHROMIC 2 0 SH (SUTURE) IMPLANT
SUT CHROMIC 3 0 SH 27 (SUTURE) IMPLANT
SYR BULB IRRIG 60ML STRL (SYRINGE) ×1 IMPLANT
SYR CONTROL 10ML LL (SYRINGE) ×1 IMPLANT
TOWEL GREEN STERILE FF (TOWEL DISPOSABLE) ×1 IMPLANT
TRAY DSU PREP LF (CUSTOM PROCEDURE TRAY) ×1 IMPLANT
TUBE CONNECTING 20X1/4 (TUBING) ×1 IMPLANT
YANKAUER SUCT BULB TIP NO VENT (SUCTIONS) IMPLANT

## 2023-06-06 NOTE — Anesthesia Preprocedure Evaluation (Addendum)
 Anesthesia Evaluation  Patient identified by MRN, date of birth, ID band Patient awake    Reviewed: Allergy & Precautions, NPO status , Patient's Chart, lab work & pertinent test results  Airway Mallampati: I  TM Distance: >3 FB Neck ROM: Full    Dental  (+) Teeth Intact, Dental Advisory Given   Pulmonary former smoker   breath sounds clear to auscultation       Cardiovascular negative cardio ROS  Rhythm:Regular Rate:Normal     Neuro/Psych negative neurological ROS  negative psych ROS   GI/Hepatic negative GI ROS, Neg liver ROS,,,  Endo/Other  negative endocrine ROS    Renal/GU negative Renal ROS     Musculoskeletal negative musculoskeletal ROS (+)    Abdominal   Peds  Hematology  (+) HIV  Anesthesia Other Findings   Reproductive/Obstetrics                             Anesthesia Physical Anesthesia Plan  ASA: 2  Anesthesia Plan: MAC   Post-op Pain Management: Ofirmev  IV (intra-op)* and Toradol IV (intra-op)*   Induction: Intravenous  PONV Risk Score and Plan: 2 and Ondansetron , Propofol  infusion and Midazolam   Airway Management Planned: Natural Airway and Nasal Cannula  Additional Equipment: None  Intra-op Plan:   Post-operative Plan:   Informed Consent: I have reviewed the patients History and Physical, chart, labs and discussed the procedure including the risks, benefits and alternatives for the proposed anesthesia with the patient or authorized representative who has indicated his/her understanding and acceptance.       Plan Discussed with: CRNA  Anesthesia Plan Comments:        Anesthesia Quick Evaluation

## 2023-06-06 NOTE — Anesthesia Procedure Notes (Signed)
 Procedure Name: MAC Date/Time: 06/06/2023 8:43 AM  Performed by: Glo Larch, CRNAPre-anesthesia Checklist: Patient identified, Emergency Drugs available, Suction available and Patient being monitored Patient Re-evaluated:Patient Re-evaluated prior to induction Oxygen Delivery Method: Simple face mask

## 2023-06-06 NOTE — Anesthesia Postprocedure Evaluation (Signed)
 Anesthesia Post Note  Patient: Kent Page  Procedure(s) Performed: HIGH RESOLUTION ANOSCOPY POSSIBLE BIOPSY     Patient location during evaluation: PACU Anesthesia Type: MAC Level of consciousness: awake and alert Pain management: pain level controlled Vital Signs Assessment: post-procedure vital signs reviewed and stable Respiratory status: spontaneous breathing, nonlabored ventilation, respiratory function stable and patient connected to nasal cannula oxygen Cardiovascular status: stable and blood pressure returned to baseline Postop Assessment: no apparent nausea or vomiting Anesthetic complications: no  No notable events documented.  Last Vitals:  Vitals:   06/06/23 0945 06/06/23 1004  BP: 106/85 130/79  Pulse: (!) 58 63  Resp: (!) 21 20  Temp:  (!) 36.4 C  SpO2: 96% 99%    Last Pain:  Vitals:   06/06/23 1004  TempSrc:   PainSc: 0-No pain                 Willian Harrow

## 2023-06-06 NOTE — Transfer of Care (Signed)
 Immediate Anesthesia Transfer of Care Note  Patient: Kent Page  Procedure(s) Performed: Procedure(s) (LRB): HIGH RESOLUTION ANOSCOPY POSSIBLE BIOPSY (N/A)  Patient Location: PACU  Anesthesia Type: MAC  Level of Consciousness: awake, alert , oriented and patient cooperative  Airway & Oxygen Therapy: Patient Spontanous Breathing and Patient connected to face mask oxygen  Post-op Assessment: Report given to PACU RN and Post -op Vital signs reviewed and stable  Post vital signs: Reviewed and stable  Complications: No apparent anesthesia complications  Last Vitals:  Vitals Value Taken Time  BP 104/66 06/06/23 0923  Temp    Pulse 92 06/06/23 0926  Resp 19 06/06/23 0926  SpO2 97 % 06/06/23 0926  Vitals shown include unfiled device data.  Last Pain:  Vitals:   06/06/23 0744  TempSrc: Temporal  PainSc: 0-No pain      Patients Stated Pain Goal: 3 (06/06/23 0744)  Complications: No notable events documented.

## 2023-06-06 NOTE — Interval H&P Note (Signed)
 History and Physical Interval Note:  06/06/2023 7:25 AM  Kent Page  has presented today for surgery, with the diagnosis of PAP SMEAR OF ANUS WITH ASCUS.  The various methods of treatment have been discussed with the patient and family. After consideration of risks, benefits and other options for treatment, the patient has consented to  Procedure(s): HIGH RESOLUTION ANOSCOPY POSSIBLE BIOPSY AND ABLATION (N/A) as a surgical intervention.  The patient's history has been reviewed, patient examined, no change in status, stable for surgery.  I have reviewed the patient's chart and labs.  Questions were answered to the patient's satisfaction.     Fernande Howells, MD  Colorectal and General Surgery [facility]

## 2023-06-06 NOTE — Op Note (Signed)
 06/06/2023  9:16 AM  PATIENT:  Kent Page  33 y.o. male  Patient Care Team: Patient, No Pcp Per as PCP - General (General Practice)  PRE-OPERATIVE DIAGNOSIS:  PAP SMEAR OF ANUS WITH ASCUS  POST-OPERATIVE DIAGNOSIS:  PAP SMEAR OF ANUS WITH ASCUS  PROCEDURE:  HIGH RESOLUTION ANOSCOPY with BIOPSY    Surgeon(s): Joyce Nixon, MD  ASSISTANT: none   ANESTHESIA:   local and MAC  EBL: 5ml  Total I/O In: -  Out: 5 [Blood:5]  SPECIMEN:  Source of Specimen:  R posterior and L posterior anal canal  DISPOSITION OF SPECIMEN:  PATHOLOGY  COUNTS:  YES  PLAN OF CARE: Discharge to home after PACU  PATIENT DISPOSITION:  PACU - hemodynamically stable.  INDICATION: ASCUS on anal Pap test  OR FINDINGS: significant acetowhite staining throughout the anal canal with several, large discrete lesions- mostly from R posterior to R anterior  DESCRIPTION: The patient was identified in the preoperative holding area and taken to the OR where they were laid supine on the operating room table in lithotomy position. MAC anesthesia was smoothly induced.  The patient was then prepped and draped in the usual sterile fashion. A surgical timeout was performed indicating the correct patient, procedure, positioning and preoperative antibioitics. SCDs were noted to be in place and functioning prior to the operation.   After this was completed, a sponge was soaked in 5% acetic acid  was placed over the perianal region. This was allowed to soak for 2 minutes. The sponge was removed and the perianal region was evaluated with a colposcope.  There were no external lesions.  The internal anal canal was evaluated via anoscopy with a Hill-Ferguson anoscope.  There were multiple,diffuse acetowhite lesions. 2 biopsies were taken for a representative sample.  I decided not to ablate and this time.  After this was completed, hemostasis was achieved with electrocautery and all of the biopsy sites were closed using a 3-0  chromic suture.   The patient was then awakened from anesthesia and sent to the postanesthesia care unit in stable condition. All counts were correct operating room staff.   Fernande Howells, MD  Colorectal and General Surgery [facility]

## 2023-06-06 NOTE — Discharge Instructions (Addendum)
 Beginning the day after surgery:  You may sit in a tub of warm water 2-3 times a day to relieve discomfort.  Eat a regular diet high in fiber.  Avoid foods that give you constipation or diarrhea.  Avoid foods that are difficult to digest, such as seeds, nuts, corn or popcorn.  Do not go any longer than 2 days without a bowel movement.  You may take a dose of Milk of Magnesia if you become constipated.    Drink 6-8 glasses of water daily.  Walking is encouraged.  Avoid strenuous activity and heavy lifting for one month after surgery.    Call the office if you have any questions or concerns.  Call immediately if you develop:  Excessive rectal bleeding (more than a cup or passing large clots) Increased discomfort Fever greater than 100 F Difficulty urinating   No Tylenol  before 1:45pm.  Post Anesthesia Home Care Instructions  Activity: Get plenty of rest for the remainder of the day. A responsible individual must stay with you for 24 hours following the procedure.  For the next 24 hours, DO NOT: -Drive a car -Advertising copywriter -Drink alcoholic beverages -Take any medication unless instructed by your physician -Make any legal decisions or sign important papers.  Meals: Start with liquid foods such as gelatin or soup. Progress to regular foods as tolerated. Avoid greasy, spicy, heavy foods. If nausea and/or vomiting occur, drink only clear liquids until the nausea and/or vomiting subsides. Call your physician if vomiting continues.  Special Instructions/Symptoms: Your throat may feel dry or sore from the anesthesia or the breathing tube placed in your throat during surgery. If this causes discomfort, gargle with warm salt water. The discomfort should disappear within 24 hours.

## 2023-06-07 ENCOUNTER — Encounter (HOSPITAL_BASED_OUTPATIENT_CLINIC_OR_DEPARTMENT_OTHER): Payer: Self-pay | Admitting: General Surgery

## 2023-06-10 LAB — SURGICAL PATHOLOGY

## 2023-07-08 NOTE — Progress Notes (Signed)
   PROVIDER:  ALICIA CHRISTINE THOMAS, MD  MRN: [id number] DOB: Nov 22, 1990 DATE OF ENCOUNTER: 07/08/2023 Interval History:   33 year old male with HIV who was noted to have ASCUS on anal Pap test.  He was taken to the operating room for high-resolution anoscopy on June 06, 2023.  No external lesions were noted but internally there were multiple diffuse acetowhite lesions.  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  Physical Examination:   Gen: NAD   Assessment and Plan:   Kent Page is a 33 y.o. male who underwent high-resolution anoscopy.  Patient with diffuse areas of staining on recent high-resolution anoscopy.  Biopsy show AIN grade 1/condyloma.  We will monitor this in the office going forward with yearly examinations for the next 2 to 3 years.  Diagnoses and all orders for this visit:  AIN (anal intraepithelial neoplasia) anal canal     Return in about 1 year (around 07/07/2024).   The plan was discussed in detail with the patient today, who expressed understanding.  The patient has my contact information, and understands to call me with any additional questions or concerns in the interval.  I would be happy to see the patient back sooner if the need arises.   [name], MD Colon and Rectal Surgery [facility]

## 2023-08-10 ENCOUNTER — Other Ambulatory Visit: Payer: Self-pay | Admitting: Infectious Disease

## 2023-08-11 NOTE — Telephone Encounter (Signed)
 Do you want to continue Bactrim 

## 2023-08-13 NOTE — Telephone Encounter (Signed)
 Continue

## 2023-11-04 ENCOUNTER — Ambulatory Visit: Payer: Self-pay | Admitting: Infectious Disease

## 2023-11-04 ENCOUNTER — Other Ambulatory Visit (HOSPITAL_COMMUNITY)
Admission: RE | Admit: 2023-11-04 | Discharge: 2023-11-04 | Disposition: A | Discharge status: Home / Self Care | Source: Ambulatory Visit | Attending: Infectious Disease | Admitting: Infectious Disease

## 2023-11-04 ENCOUNTER — Other Ambulatory Visit: Payer: Self-pay

## 2023-11-04 ENCOUNTER — Encounter: Payer: Self-pay | Admitting: Infectious Disease

## 2023-11-04 VITALS — BP 128/82 | HR 65 | Temp 97.4°F | Ht 66.0 in | Wt 146.0 lb

## 2023-11-04 DIAGNOSIS — B20 Human immunodeficiency virus [HIV] disease: Secondary | ICD-10-CM | POA: Diagnosis present

## 2023-11-04 DIAGNOSIS — Z7185 Encounter for immunization safety counseling: Secondary | ICD-10-CM | POA: Diagnosis present

## 2023-11-04 DIAGNOSIS — R85610 Atypical squamous cells of undetermined significance on cytologic smear of anus (ASC-US): Secondary | ICD-10-CM | POA: Insufficient documentation

## 2023-11-04 LAB — URINE CYTOLOGY ANCILLARY ONLY
Chlamydia: NEGATIVE
Comment: NEGATIVE
Comment: NORMAL
Neisseria Gonorrhea: NEGATIVE

## 2023-11-04 LAB — T-HELPER CELLS (CD4) COUNT (NOT AT ARMC)
CD4 % Helper T Cell: 18 % — ABNORMAL LOW (ref 33–65)
CD4 T Cell Abs: 364 /uL — ABNORMAL LOW (ref 400–1790)

## 2023-11-04 MED ORDER — BICTEGRAVIR-EMTRICITAB-TENOFOV 50-200-25 MG PO TABS: 1.0000 | ORAL_TABLET | Freq: Every day | ORAL | 11 refills | Status: AC

## 2023-11-04 NOTE — Progress Notes (Signed)
 Subjective:  Chief complaint: follow-up for HIV disease on medications   Patient ID: Kent Page, male    DOB: 01/05/1991, 33 y.o.   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    Social History   Socioeconomic History   Marital status: Single    Spouse name: Not on file   Number of children: Not on file   Years of education: Not on file   Highest education level: Not on file  Occupational History   Not on file  Tobacco Use   Smoking status: Former    Types: Cigarettes    Passive exposure: Past   Smokeless tobacco: Never  Vaping Use   Vaping status: Never Used  Substance and Sexual Activity   Alcohol use: Yes    Alcohol/week: 18.0 standard drinks of alcohol    Types: 18 Cans of beer per week    Comment: 2-3 daily or 1 case of beer daily, depends   Drug use: Yes    Types: Marijuana    Comment: this am   Sexual activity: Not on file    Comment: accepted condoms  Other Topics Concern   Not on file  Social History Narrative   Not on file   Social Drivers of Health   Financial Resource Strain: Not on file  Food Insecurity: Not on file  Transportation Needs: Not on file  Physical Activity: Not on file  Stress: Not on file  Social Connections: Not on file    No Known Allergies   Current Outpatient Medications:    bictegravir-emtricitabine -tenofovir  AF (BIKTARVY ) 50-200-25 MG TABS tablet, Take 1 tablet  by mouth daily., Disp: 30 tablet, Rfl: 11   Review of Systems  Constitutional:  Negative for activity change, appetite change, chills, diaphoresis, fatigue, fever and unexpected weight change.  HENT:  Negative for congestion, rhinorrhea, sinus pressure, sneezing, sore throat and trouble swallowing.   Eyes:  Negative for photophobia and visual disturbance.  Respiratory:  Negative for cough, chest tightness, shortness of breath, wheezing and stridor.   Cardiovascular:  Negative for chest pain, palpitations and leg swelling.  Gastrointestinal:  Negative for abdominal distention, abdominal pain, anal bleeding, blood in stool, constipation, diarrhea, nausea and vomiting.  Genitourinary:  Negative for difficulty urinating, dysuria, flank pain and hematuria.  Musculoskeletal:  Negative for arthralgias, back pain, gait problem, joint swelling and myalgias.  Skin:  Negative for color change, pallor, rash and wound.  Neurological:  Negative for dizziness, tremors, weakness and light-headedness.  Hematological:  Negative for adenopathy. Does not bruise/bleed easily.  Psychiatric/Behavioral:  Negative for agitation, behavioral problems, confusion, decreased concentration, dysphoric mood and sleep disturbance.        Objective:   Physical Exam Constitutional:      Appearance: He is well-developed.  HENT:     Head: Normocephalic and atraumatic.  Eyes:     Conjunctiva/sclera: Conjunctivae normal.  Cardiovascular:     Rate and Rhythm: Normal rate and  regular rhythm.  Pulmonary:     Effort: Pulmonary effort is normal. No respiratory distress.     Breath sounds: No wheezing.  Abdominal:     General: There is no distension.     Palpations: Abdomen is soft.  Musculoskeletal:        General: No tenderness. Normal range of motion.     Cervical back: Normal range of motion and neck supple.  Skin:    General: Skin is warm and dry.     Coloration: Skin is not pale.     Findings: No erythema or rash.   Neurological:     General: No focal deficit present.     Mental Status: He is alert and oriented to person, place, and time.  Psychiatric:        Mood and Affect: Mood normal.        Behavior: Behavior normal.        Thought Content: Thought content normal.        Judgment: Judgment normal.           Assessment & Plan:   Assessment and Plan    Human immunodeficiency virus (HIV) disease - Order labs to monitor HIV status, viral load, CD4 count - Prescribe Biktarvy , send prescription to [facility] and [facility].   Atypical squamous cells of undetermined significance (ASC-US ) on anal cytology, resolved Previous ASC-US  resolved, high-resolution anoscopy showed no significant findings. - Follow-up in one year with Dr. Debby  Immunization counseling (influenza and COVID-19) Discussed importance of influenza vaccination and availability of COVID-19 vaccines at pharmacies. - Encourage influenza vaccination before flu season. - Advise obtaining COVID-19 vaccine from a pharmacy.

## 2023-11-06 LAB — COMPLETE METABOLIC PANEL WITHOUT GFR
AG Ratio: 2 (calc) (ref 1.0–2.5)
ALT: 18 U/L (ref 9–46)
AST: 20 U/L (ref 10–40)
Albumin: 4.7 g/dL (ref 3.6–5.1)
Alkaline phosphatase (APISO): 52 U/L (ref 36–130)
BUN: 11 mg/dL (ref 7–25)
CO2: 30 mmol/L (ref 20–32)
Calcium: 9.5 mg/dL (ref 8.6–10.3)
Chloride: 102 mmol/L (ref 98–110)
Creat: 1.2 mg/dL (ref 0.60–1.26)
Globulin: 2.4 g/dL (ref 1.9–3.7)
Glucose, Bld: 94 mg/dL (ref 65–99)
Potassium: 4.2 mmol/L (ref 3.5–5.3)
Sodium: 138 mmol/L (ref 135–146)
Total Bilirubin: 0.6 mg/dL (ref 0.2–1.2)
Total Protein: 7.1 g/dL (ref 6.1–8.1)

## 2023-11-06 LAB — CBC WITH DIFFERENTIAL/PLATELET
Absolute Lymphocytes: 2332 {cells}/uL (ref 850–3900)
Absolute Monocytes: 480 {cells}/uL (ref 200–950)
Basophils Absolute: 29 {cells}/uL (ref 0–200)
Basophils Relative: 0.6 %
Eosinophils Absolute: 250 {cells}/uL (ref 15–500)
Eosinophils Relative: 5.1 %
HCT: 45.9 % (ref 38.5–50.0)
Hemoglobin: 15.7 g/dL (ref 13.2–17.1)
MCH: 33.3 pg — ABNORMAL HIGH (ref 27.0–33.0)
MCHC: 34.2 g/dL (ref 32.0–36.0)
MCV: 97.5 fL (ref 80.0–100.0)
MPV: 9.1 fL (ref 7.5–12.5)
Monocytes Relative: 9.8 %
Neutro Abs: 1808 {cells}/uL (ref 1500–7800)
Neutrophils Relative %: 36.9 %
Platelets: 306 Thousand/uL (ref 140–400)
RBC: 4.71 Million/uL (ref 4.20–5.80)
RDW: 13 % (ref 11.0–15.0)
Total Lymphocyte: 47.6 %
WBC: 4.9 Thousand/uL (ref 3.8–10.8)

## 2023-11-06 LAB — HIV-1 RNA QUANT-NO REFLEX-BLD
HIV 1 RNA Quant: <20 {copies}/mL — AB
HIV-1 RNA Quant, Log: <1.3 {Log_copies}/mL — AB

## 2023-11-06 LAB — LIPID PANEL
Cholesterol: 215 mg/dL — ABNORMAL HIGH (ref <200)
HDL: 62 mg/dL (ref >=40)
LDL Cholesterol (Calc): 131 mg/dL — ABNORMAL HIGH
Non-HDL Cholesterol (Calc): 153 mg/dL — ABNORMAL HIGH (ref <130)
Total CHOL/HDL Ratio: 3.5 (calc) (ref <5.0)
Triglycerides: 114 mg/dL (ref <150)

## 2023-11-06 LAB — RPR: RPR Ser Ql: NONREACTIVE

## 2024-05-03 ENCOUNTER — Ambulatory Visit: Admitting: Infectious Disease
